# Patient Record
Sex: Female | Born: 1950 | Race: White | Hispanic: No | Marital: Married | Smoking: Former smoker
Health system: Southern US, Community
[De-identification: ages and names within clinical notes are randomized; demographics above are authoritative.]

## PROBLEM LIST (undated history)

## (undated) DIAGNOSIS — F419 Anxiety disorder, unspecified: Secondary | ICD-10-CM

## (undated) DIAGNOSIS — C189 Malignant neoplasm of colon, unspecified: Secondary | ICD-10-CM

## (undated) DIAGNOSIS — L719 Rosacea, unspecified: Secondary | ICD-10-CM

## (undated) DIAGNOSIS — Z87442 Personal history of urinary calculi: Secondary | ICD-10-CM

## (undated) DIAGNOSIS — Z800 Family history of malignant neoplasm of digestive organs: Secondary | ICD-10-CM

## (undated) DIAGNOSIS — K219 Gastro-esophageal reflux disease without esophagitis: Secondary | ICD-10-CM

## (undated) DIAGNOSIS — M549 Dorsalgia, unspecified: Secondary | ICD-10-CM

## (undated) DIAGNOSIS — R238 Other skin changes: Secondary | ICD-10-CM

## (undated) DIAGNOSIS — R42 Dizziness and giddiness: Secondary | ICD-10-CM

## (undated) DIAGNOSIS — D649 Anemia, unspecified: Secondary | ICD-10-CM

## (undated) DIAGNOSIS — G8929 Other chronic pain: Secondary | ICD-10-CM

## (undated) DIAGNOSIS — R87610 Atypical squamous cells of undetermined significance on cytologic smear of cervix (ASC-US): Secondary | ICD-10-CM

## (undated) DIAGNOSIS — E669 Obesity, unspecified: Secondary | ICD-10-CM

## (undated) DIAGNOSIS — J310 Chronic rhinitis: Secondary | ICD-10-CM

## (undated) DIAGNOSIS — Z889 Allergy status to unspecified drugs, medicaments and biological substances status: Secondary | ICD-10-CM

## (undated) DIAGNOSIS — N183 Chronic kidney disease, stage 3 unspecified: Secondary | ICD-10-CM

## (undated) DIAGNOSIS — K5792 Diverticulitis of intestine, part unspecified, without perforation or abscess without bleeding: Secondary | ICD-10-CM

## (undated) DIAGNOSIS — C649 Malignant neoplasm of unspecified kidney, except renal pelvis: Secondary | ICD-10-CM

## (undated) DIAGNOSIS — B3731 Acute candidiasis of vulva and vagina: Secondary | ICD-10-CM

## (undated) DIAGNOSIS — I499 Cardiac arrhythmia, unspecified: Secondary | ICD-10-CM

## (undated) DIAGNOSIS — L821 Other seborrheic keratosis: Secondary | ICD-10-CM

## (undated) DIAGNOSIS — M25561 Pain in right knee: Secondary | ICD-10-CM

## (undated) DIAGNOSIS — J302 Other seasonal allergic rhinitis: Secondary | ICD-10-CM

## (undated) HISTORY — DX: Obesity, unspecified: E66.9

## (undated) HISTORY — DX: Family history of malignant neoplasm of digestive organs: Z80.0

## (undated) HISTORY — PX: TUBAL LIGATION: SHX77

## (undated) HISTORY — PX: BARIATRIC SURGERY: SHX1103

## (undated) HISTORY — DX: Atypical squamous cells of undetermined significance on cytologic smear of cervix (ASC-US): R87.610

## (undated) HISTORY — DX: Other seasonal allergic rhinitis: J30.2

## (undated) HISTORY — DX: Malignant neoplasm of unspecified kidney, except renal pelvis: C64.9

## (undated) HISTORY — DX: Gastro-esophageal reflux disease without esophagitis: K21.9

## (undated) HISTORY — DX: Diverticulitis of intestine, part unspecified, without perforation or abscess without bleeding: K57.92

## (undated) HISTORY — PX: COLON SURGERY: SHX602

## (undated) HISTORY — PX: HEMORRHOID SURGERY: SHX153

## (undated) HISTORY — DX: Anemia, unspecified: D64.9

## (undated) HISTORY — DX: Other seborrheic keratosis: L82.1

## (undated) HISTORY — DX: Malignant neoplasm of colon, unspecified: C18.9

## (undated) HISTORY — DX: Pain in right knee: M25.561

## (undated) HISTORY — DX: Other skin changes: R23.8

## (undated) HISTORY — DX: Rosacea, unspecified: L71.9

## (undated) HISTORY — DX: Dizziness and giddiness: R42

## (undated) HISTORY — PX: CARPAL TUNNEL RELEASE: SHX101

## (undated) HISTORY — PX: KIDNEY SURGERY: SHX687

## (undated) HISTORY — PX: CATARACT EXTRACTION, BILATERAL: SHX1313

## (undated) HISTORY — PX: COLPOSCOPY: SHX161

## (undated) HISTORY — PX: NEPHRECTOMY: SHX65

## (undated) HISTORY — DX: Chronic rhinitis: J31.0

## (undated) HISTORY — PX: HAND SURGERY: SHX662

## (undated) HISTORY — DX: Anxiety disorder, unspecified: F41.9

## (undated) HISTORY — DX: Acute candidiasis of vulva and vagina: B37.31

## (undated) HISTORY — DX: Chronic kidney disease, stage 3 unspecified: N18.30

---
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
## 1980-05-18 HISTORY — PX: GASTRIC BYPASS: SHX52

## 2000-01-26 ENCOUNTER — Other Ambulatory Visit: Admission: RE | Admit: 2000-01-26 | Discharge: 2000-01-26 | Payer: Self-pay | Admitting: *Deleted

## 2000-01-28 ENCOUNTER — Encounter: Payer: Self-pay | Admitting: *Deleted

## 2000-01-28 ENCOUNTER — Ambulatory Visit (HOSPITAL_COMMUNITY): Admission: RE | Admit: 2000-01-28 | Discharge: 2000-01-28 | Payer: Self-pay | Admitting: *Deleted

## 2001-06-17 ENCOUNTER — Ambulatory Visit (HOSPITAL_BASED_OUTPATIENT_CLINIC_OR_DEPARTMENT_OTHER): Admission: RE | Admit: 2001-06-17 | Discharge: 2001-06-17 | Payer: Self-pay | Admitting: Orthopedic Surgery

## 2001-08-12 ENCOUNTER — Ambulatory Visit (HOSPITAL_BASED_OUTPATIENT_CLINIC_OR_DEPARTMENT_OTHER): Admission: RE | Admit: 2001-08-12 | Discharge: 2001-08-12 | Payer: Self-pay | Admitting: Orthopedic Surgery

## 2006-07-05 ENCOUNTER — Encounter: Admission: RE | Admit: 2006-07-05 | Discharge: 2006-07-05 | Payer: Self-pay | Admitting: Orthopedic Surgery

## 2006-09-03 ENCOUNTER — Ambulatory Visit (HOSPITAL_COMMUNITY): Admission: RE | Admit: 2006-09-03 | Discharge: 2006-09-03 | Payer: Self-pay | Admitting: Gastroenterology

## 2006-09-03 ENCOUNTER — Encounter (INDEPENDENT_AMBULATORY_CARE_PROVIDER_SITE_OTHER): Payer: Self-pay | Admitting: Specialist

## 2006-09-12 ENCOUNTER — Encounter: Admission: RE | Admit: 2006-09-12 | Discharge: 2006-09-12 | Payer: Self-pay | Admitting: Surgery

## 2006-09-16 HISTORY — PX: HERNIA REPAIR: SHX51

## 2006-09-20 ENCOUNTER — Emergency Department (HOSPITAL_COMMUNITY): Admission: EM | Admit: 2006-09-20 | Discharge: 2006-09-21 | Payer: Self-pay | Admitting: Emergency Medicine

## 2006-09-20 ENCOUNTER — Encounter (INDEPENDENT_AMBULATORY_CARE_PROVIDER_SITE_OTHER): Payer: Self-pay | Admitting: Specialist

## 2006-09-20 ENCOUNTER — Ambulatory Visit (HOSPITAL_COMMUNITY): Admission: RE | Admit: 2006-09-20 | Discharge: 2006-09-20 | Payer: Self-pay | Admitting: Surgery

## 2006-09-25 ENCOUNTER — Emergency Department (HOSPITAL_COMMUNITY): Admission: EM | Admit: 2006-09-25 | Discharge: 2006-09-26 | Payer: Self-pay | Admitting: Emergency Medicine

## 2006-10-04 ENCOUNTER — Encounter (INDEPENDENT_AMBULATORY_CARE_PROVIDER_SITE_OTHER): Payer: Self-pay | Admitting: Surgery

## 2006-10-04 ENCOUNTER — Inpatient Hospital Stay (HOSPITAL_COMMUNITY): Admission: RE | Admit: 2006-10-04 | Discharge: 2006-10-10 | Payer: Self-pay | Admitting: Surgery

## 2006-10-13 ENCOUNTER — Ambulatory Visit: Payer: Self-pay | Admitting: Oncology

## 2006-11-02 ENCOUNTER — Ambulatory Visit (HOSPITAL_COMMUNITY): Admission: RE | Admit: 2006-11-02 | Discharge: 2006-11-02 | Payer: Self-pay | Admitting: Oncology

## 2006-11-02 LAB — CBC WITH DIFFERENTIAL/PLATELET
BASO%: 0.6 % (ref 0.0–2.0)
Basophils Absolute: 0 10*3/uL (ref 0.0–0.1)
HCT: 34.3 % — ABNORMAL LOW (ref 34.8–46.6)
HGB: 11.6 g/dL (ref 11.6–15.9)
LYMPH%: 20 % (ref 14.0–48.0)
MCH: 26.7 pg (ref 26.0–34.0)
MCHC: 33.9 g/dL (ref 32.0–36.0)
MONO#: 0.4 10*3/uL (ref 0.1–0.9)
NEUT%: 70.4 % (ref 39.6–76.8)
Platelets: 231 10*3/uL (ref 145–400)
WBC: 7.2 10*3/uL (ref 3.9–10.0)

## 2006-11-02 LAB — COMPREHENSIVE METABOLIC PANEL
ALT: 10 U/L (ref 0–35)
BUN: 12 mg/dL (ref 6–23)
CO2: 22 mEq/L (ref 19–32)
Calcium: 8.9 mg/dL (ref 8.4–10.5)
Chloride: 107 mEq/L (ref 96–112)
Creatinine, Ser: 1.38 mg/dL — ABNORMAL HIGH (ref 0.40–1.20)
Total Bilirubin: 0.3 mg/dL (ref 0.3–1.2)

## 2006-11-02 LAB — CEA: CEA: 1.4 ng/mL (ref 0.0–5.0)

## 2006-11-02 LAB — FERRITIN: Ferritin: 67 ng/mL (ref 10–291)

## 2006-11-05 ENCOUNTER — Ambulatory Visit (HOSPITAL_COMMUNITY): Admission: RE | Admit: 2006-11-05 | Discharge: 2006-11-05 | Payer: Self-pay | Admitting: Surgery

## 2006-11-11 LAB — COMPREHENSIVE METABOLIC PANEL
ALT: 11 U/L (ref 0–35)
AST: 12 U/L (ref 0–37)
Albumin: 3.1 g/dL — ABNORMAL LOW (ref 3.5–5.2)
Alkaline Phosphatase: 48 U/L (ref 39–117)
Calcium: 8.9 mg/dL (ref 8.4–10.5)
Chloride: 105 mEq/L (ref 96–112)
Potassium: 4 mEq/L (ref 3.5–5.3)
Sodium: 140 mEq/L (ref 135–145)
Total Protein: 5.9 g/dL — ABNORMAL LOW (ref 6.0–8.3)

## 2006-11-23 ENCOUNTER — Ambulatory Visit: Payer: Self-pay | Admitting: Oncology

## 2006-11-25 LAB — CBC WITH DIFFERENTIAL/PLATELET
BASO%: 0.7 % (ref 0.0–2.0)
Basophils Absolute: 0 10*3/uL (ref 0.0–0.1)
EOS%: 9.9 % — ABNORMAL HIGH (ref 0.0–7.0)
HGB: 11 g/dL — ABNORMAL LOW (ref 11.6–15.9)
MCH: 27 pg (ref 26.0–34.0)
MCHC: 33.9 g/dL (ref 32.0–36.0)
MCV: 79.6 fL — ABNORMAL LOW (ref 81.0–101.0)
MONO%: 3.1 % (ref 0.0–13.0)
RDW: 17.3 % — ABNORMAL HIGH (ref 11.3–14.5)
lymph#: 1.2 10*3/uL (ref 0.9–3.3)

## 2006-11-25 LAB — COMPREHENSIVE METABOLIC PANEL
ALT: 13 U/L (ref 0–35)
AST: 16 U/L (ref 0–37)
Albumin: 3.4 g/dL — ABNORMAL LOW (ref 3.5–5.2)
Alkaline Phosphatase: 64 U/L (ref 39–117)
BUN: 15 mg/dL (ref 6–23)
Chloride: 107 mEq/L (ref 96–112)
Creatinine, Ser: 1.27 mg/dL — ABNORMAL HIGH (ref 0.40–1.20)
Potassium: 4.5 mEq/L (ref 3.5–5.3)

## 2006-12-09 LAB — CBC WITH DIFFERENTIAL/PLATELET
BASO%: 0.9 % (ref 0.0–2.0)
Basophils Absolute: 0.1 10*3/uL (ref 0.0–0.1)
EOS%: 5.1 % (ref 0.0–7.0)
HCT: 32.1 % — ABNORMAL LOW (ref 34.8–46.6)
HGB: 10.4 g/dL — ABNORMAL LOW (ref 11.6–15.9)
MCH: 26.6 pg (ref 26.0–34.0)
MCHC: 32.5 g/dL (ref 32.0–36.0)
MCV: 81.8 fL (ref 81.0–101.0)
MONO%: 8.9 % (ref 0.0–13.0)
NEUT%: 64.7 % (ref 39.6–76.8)

## 2006-12-09 LAB — COMPREHENSIVE METABOLIC PANEL
AST: 22 U/L (ref 0–37)
Alkaline Phosphatase: 56 U/L (ref 39–117)
BUN: 12 mg/dL (ref 6–23)
Creatinine, Ser: 1.03 mg/dL (ref 0.40–1.20)
Total Bilirubin: 0.5 mg/dL (ref 0.3–1.2)

## 2006-12-23 LAB — CBC WITH DIFFERENTIAL/PLATELET
Basophils Absolute: 0 10*3/uL (ref 0.0–0.1)
EOS%: 5 % (ref 0.0–7.0)
HCT: 30.7 % — ABNORMAL LOW (ref 34.8–46.6)
HGB: 10.6 g/dL — ABNORMAL LOW (ref 11.6–15.9)
LYMPH%: 24.7 % (ref 14.0–48.0)
MCH: 28.4 pg (ref 26.0–34.0)
MCV: 82 fL (ref 81.0–101.0)
NEUT%: 64.5 % (ref 39.6–76.8)
Platelets: 98 10*3/uL — ABNORMAL LOW (ref 145–400)
lymph#: 1.2 10*3/uL (ref 0.9–3.3)

## 2006-12-23 LAB — COMPREHENSIVE METABOLIC PANEL
AST: 20 U/L (ref 0–37)
BUN: 6 mg/dL (ref 6–23)
Calcium: 8.6 mg/dL (ref 8.4–10.5)
Chloride: 103 mEq/L (ref 96–112)
Creatinine, Ser: 1.22 mg/dL — ABNORMAL HIGH (ref 0.40–1.20)
Total Bilirubin: 0.4 mg/dL (ref 0.3–1.2)

## 2007-01-06 LAB — COMPREHENSIVE METABOLIC PANEL
BUN: 12 mg/dL (ref 6–23)
CO2: 27 mEq/L (ref 19–32)
Calcium: 9.1 mg/dL (ref 8.4–10.5)
Chloride: 108 mEq/L (ref 96–112)
Creatinine, Ser: 1.15 mg/dL (ref 0.40–1.20)

## 2007-01-06 LAB — CBC WITH DIFFERENTIAL/PLATELET
Basophils Absolute: 0 10*3/uL (ref 0.0–0.1)
HCT: 30.4 % — ABNORMAL LOW (ref 34.8–46.6)
HGB: 10.4 g/dL — ABNORMAL LOW (ref 11.6–15.9)
MCH: 28.8 pg (ref 26.0–34.0)
MONO#: 0.4 10*3/uL (ref 0.1–0.9)
NEUT%: 60.1 % (ref 39.6–76.8)
WBC: 4.4 10*3/uL (ref 3.9–10.0)
lymph#: 1.1 10*3/uL (ref 0.9–3.3)

## 2007-01-08 ENCOUNTER — Ambulatory Visit: Payer: Self-pay | Admitting: Oncology

## 2007-01-20 LAB — CBC WITH DIFFERENTIAL/PLATELET
Eosinophils Absolute: 0.2 10*3/uL (ref 0.0–0.5)
HCT: 33.7 % — ABNORMAL LOW (ref 34.8–46.6)
LYMPH%: 17.8 % (ref 14.0–48.0)
MONO#: 0.4 10*3/uL (ref 0.1–0.9)
NEUT#: 6 10*3/uL (ref 1.5–6.5)
NEUT%: 73.6 % (ref 39.6–76.8)
Platelets: 113 10*3/uL — ABNORMAL LOW (ref 145–400)
WBC: 8.1 10*3/uL (ref 3.9–10.0)

## 2007-01-20 LAB — COMPREHENSIVE METABOLIC PANEL
ALT: 15 U/L (ref 0–35)
CO2: 25 mEq/L (ref 19–32)
Calcium: 9.3 mg/dL (ref 8.4–10.5)
Chloride: 107 mEq/L (ref 96–112)
Sodium: 139 mEq/L (ref 135–145)
Total Protein: 6.7 g/dL (ref 6.0–8.3)

## 2007-02-03 LAB — COMPREHENSIVE METABOLIC PANEL
ALT: 14 U/L (ref 0–35)
AST: 19 U/L (ref 0–37)
CO2: 28 mEq/L (ref 19–32)
Creatinine, Ser: 1.16 mg/dL (ref 0.40–1.20)
Total Bilirubin: 0.3 mg/dL (ref 0.3–1.2)

## 2007-02-03 LAB — CBC WITH DIFFERENTIAL/PLATELET
BASO%: 0.6 % (ref 0.0–2.0)
EOS%: 5 % (ref 0.0–7.0)
HCT: 30.4 % — ABNORMAL LOW (ref 34.8–46.6)
LYMPH%: 21.7 % (ref 14.0–48.0)
MCH: 30 pg (ref 26.0–34.0)
MCHC: 34.2 g/dL (ref 32.0–36.0)
NEUT%: 68.9 % (ref 39.6–76.8)
Platelets: 130 10*3/uL — ABNORMAL LOW (ref 145–400)

## 2007-02-17 LAB — CBC WITH DIFFERENTIAL/PLATELET
BASO%: 0.4 % (ref 0.0–2.0)
Basophils Absolute: 0 10*3/uL (ref 0.0–0.1)
HCT: 29.6 % — ABNORMAL LOW (ref 34.8–46.6)
LYMPH%: 21.2 % (ref 14.0–48.0)
MCHC: 34.8 g/dL (ref 32.0–36.0)
MONO#: 0.3 10*3/uL (ref 0.1–0.9)
NEUT%: 66.4 % (ref 39.6–76.8)
Platelets: 110 10*3/uL — ABNORMAL LOW (ref 145–400)
WBC: 5.4 10*3/uL (ref 3.9–10.0)

## 2007-02-17 LAB — COMPREHENSIVE METABOLIC PANEL
ALT: 15 U/L (ref 0–35)
BUN: 11 mg/dL (ref 6–23)
CO2: 27 mEq/L (ref 19–32)
Creatinine, Ser: 1.25 mg/dL — ABNORMAL HIGH (ref 0.40–1.20)
Glucose, Bld: 153 mg/dL — ABNORMAL HIGH (ref 70–99)
Total Bilirubin: 0.3 mg/dL (ref 0.3–1.2)

## 2007-02-25 ENCOUNTER — Ambulatory Visit: Payer: Self-pay | Admitting: Oncology

## 2007-03-01 ENCOUNTER — Ambulatory Visit (HOSPITAL_COMMUNITY): Admission: RE | Admit: 2007-03-01 | Discharge: 2007-03-01 | Payer: Self-pay | Admitting: Oncology

## 2007-03-01 LAB — CBC WITH DIFFERENTIAL/PLATELET
Eosinophils Absolute: 0.2 10*3/uL (ref 0.0–0.5)
HCT: 30.6 % — ABNORMAL LOW (ref 34.8–46.6)
HGB: 10.7 g/dL — ABNORMAL LOW (ref 11.6–15.9)
LYMPH%: 19.9 % (ref 14.0–48.0)
MONO#: 0.5 10*3/uL (ref 0.1–0.9)
NEUT#: 4.1 10*3/uL (ref 1.5–6.5)
NEUT%: 67.7 % (ref 39.6–76.8)
Platelets: 85 10*3/uL — ABNORMAL LOW (ref 145–400)
WBC: 6.1 10*3/uL (ref 3.9–10.0)
lymph#: 1.2 10*3/uL (ref 0.9–3.3)

## 2007-03-01 LAB — COMPREHENSIVE METABOLIC PANEL
AST: 20 U/L (ref 0–37)
Albumin: 3.2 g/dL — ABNORMAL LOW (ref 3.5–5.2)
Alkaline Phosphatase: 68 U/L (ref 39–117)
Potassium: 4.1 mEq/L (ref 3.5–5.3)
Sodium: 142 mEq/L (ref 135–145)
Total Protein: 6.6 g/dL (ref 6.0–8.3)

## 2007-03-01 LAB — URINALYSIS, MICROSCOPIC - CHCC
Protein: <30 mg/dL
pH: 6 (ref 4.6–8.0)

## 2007-03-03 LAB — CBC WITH DIFFERENTIAL/PLATELET
Basophils Absolute: 0 10*3/uL (ref 0.0–0.1)
EOS%: 5.3 % (ref 0.0–7.0)
Eosinophils Absolute: 0.3 10*3/uL (ref 0.0–0.5)
HCT: 31 % — ABNORMAL LOW (ref 34.8–46.6)
HGB: 10.4 g/dL — ABNORMAL LOW (ref 11.6–15.9)
MCH: 30.9 pg (ref 26.0–34.0)
MCV: 92 fL (ref 81.0–101.0)
NEUT#: 3 10*3/uL (ref 1.5–6.5)
NEUT%: 64.6 % (ref 39.6–76.8)
lymph#: 1.1 10*3/uL (ref 0.9–3.3)

## 2007-03-03 LAB — URINE CULTURE

## 2007-03-10 LAB — CBC WITH DIFFERENTIAL/PLATELET
BASO%: 0.5 % (ref 0.0–2.0)
HCT: 30.9 % — ABNORMAL LOW (ref 34.8–46.6)
HGB: 10.7 g/dL — ABNORMAL LOW (ref 11.6–15.9)
MCH: 31.5 pg (ref 26.0–34.0)
MCV: 90.8 fL (ref 81.0–101.0)
MONO#: 0.3 10*3/uL (ref 0.1–0.9)
Platelets: 147 10*3/uL (ref 145–400)
RBC: 3.4 10*6/uL — ABNORMAL LOW (ref 3.70–5.32)
lymph#: 1.4 10*3/uL (ref 0.9–3.3)

## 2007-03-17 LAB — COMPREHENSIVE METABOLIC PANEL
ALT: 14 U/L (ref 0–35)
CO2: 29 mEq/L (ref 19–32)
Calcium: 9.7 mg/dL (ref 8.4–10.5)
Chloride: 102 mEq/L (ref 96–112)
Sodium: 140 mEq/L (ref 135–145)
Total Bilirubin: 0.6 mg/dL (ref 0.3–1.2)
Total Protein: 6.7 g/dL (ref 6.0–8.3)

## 2007-03-17 LAB — CBC WITH DIFFERENTIAL/PLATELET
BASO%: 0.6 % (ref 0.0–2.0)
MCHC: 33.7 g/dL (ref 32.0–36.0)
MONO#: 0.4 10*3/uL (ref 0.1–0.9)
RBC: 3.62 10*6/uL — ABNORMAL LOW (ref 3.70–5.32)
WBC: 6.8 10*3/uL (ref 3.9–10.0)
lymph#: 1.2 10*3/uL (ref 0.9–3.3)

## 2007-03-31 LAB — CBC WITH DIFFERENTIAL/PLATELET
BASO%: 0.4 % (ref 0.0–2.0)
EOS%: 3.7 % (ref 0.0–7.0)
LYMPH%: 16.4 % (ref 14.0–48.0)
MCH: 31 pg (ref 26.0–34.0)
MCHC: 33.9 g/dL (ref 32.0–36.0)
MONO#: 0.4 10*3/uL (ref 0.1–0.9)
Platelets: 150 10*3/uL (ref 145–400)
RBC: 3.47 10*6/uL — ABNORMAL LOW (ref 3.70–5.32)
WBC: 6.5 10*3/uL (ref 3.9–10.0)

## 2007-04-19 ENCOUNTER — Ambulatory Visit: Payer: Self-pay | Admitting: Oncology

## 2007-04-21 LAB — CBC WITH DIFFERENTIAL/PLATELET
BASO%: 0.5 % (ref 0.0–2.0)
EOS%: 3.1 % (ref 0.0–7.0)
HCT: 32.3 % — ABNORMAL LOW (ref 34.8–46.6)
MCHC: 35 g/dL (ref 32.0–36.0)
MONO#: 0.4 10*3/uL (ref 0.1–0.9)
NEUT%: 68.6 % (ref 39.6–76.8)
RBC: 3.5 10*6/uL — ABNORMAL LOW (ref 3.70–5.32)
RDW: 15.6 % — ABNORMAL HIGH (ref 11.3–14.5)
WBC: 6.2 10*3/uL (ref 3.9–10.0)
lymph#: 1.4 10*3/uL (ref 0.9–3.3)

## 2007-04-21 LAB — COMPREHENSIVE METABOLIC PANEL
ALT: 12 U/L (ref 0–35)
AST: 19 U/L (ref 0–37)
Albumin: 3.4 g/dL — ABNORMAL LOW (ref 3.5–5.2)
CO2: 23 mEq/L (ref 19–32)
Calcium: 9.2 mg/dL (ref 8.4–10.5)
Chloride: 102 mEq/L (ref 96–112)
Potassium: 3.5 mEq/L (ref 3.5–5.3)
Sodium: 134 mEq/L — ABNORMAL LOW (ref 135–145)
Total Protein: 6.9 g/dL (ref 6.0–8.3)

## 2007-05-27 ENCOUNTER — Other Ambulatory Visit: Admission: RE | Admit: 2007-05-27 | Discharge: 2007-05-27 | Payer: Self-pay | Admitting: Gynecology

## 2007-06-02 ENCOUNTER — Ambulatory Visit: Payer: Self-pay | Admitting: Oncology

## 2007-07-12 ENCOUNTER — Ambulatory Visit: Payer: Self-pay | Admitting: Oncology

## 2007-08-18 ENCOUNTER — Ambulatory Visit (HOSPITAL_COMMUNITY): Admission: RE | Admit: 2007-08-18 | Discharge: 2007-08-18 | Payer: Self-pay | Admitting: Oncology

## 2007-08-18 LAB — COMPREHENSIVE METABOLIC PANEL
Alkaline Phosphatase: 73 U/L (ref 39–117)
BUN: 15 mg/dL (ref 6–23)
Glucose, Bld: 101 mg/dL — ABNORMAL HIGH (ref 70–99)
Total Bilirubin: 0.6 mg/dL (ref 0.3–1.2)

## 2007-08-18 LAB — CBC WITH DIFFERENTIAL/PLATELET
Basophils Absolute: 0 10*3/uL (ref 0.0–0.1)
Eosinophils Absolute: 0.2 10*3/uL (ref 0.0–0.5)
HCT: 34.6 % — ABNORMAL LOW (ref 34.8–46.6)
HGB: 11.9 g/dL (ref 11.6–15.9)
LYMPH%: 20.7 % (ref 14.0–48.0)
MCV: 82.1 fL (ref 81.0–101.0)
MONO%: 4.9 % (ref 0.0–13.0)
NEUT#: 5 10*3/uL (ref 1.5–6.5)
NEUT%: 70.8 % (ref 39.6–76.8)
Platelets: 193 10*3/uL (ref 145–400)

## 2007-08-18 LAB — CEA: CEA: <0.5 ng/mL (ref 0.0–5.0)

## 2007-08-23 ENCOUNTER — Ambulatory Visit: Payer: Self-pay | Admitting: Oncology

## 2007-08-25 LAB — BASIC METABOLIC PANEL
CO2: 24 mEq/L (ref 19–32)
Glucose, Bld: 82 mg/dL (ref 70–99)
Potassium: 4.5 mEq/L (ref 3.5–5.3)
Sodium: 141 mEq/L (ref 135–145)

## 2007-09-08 ENCOUNTER — Ambulatory Visit (HOSPITAL_COMMUNITY): Admission: RE | Admit: 2007-09-08 | Discharge: 2007-09-08 | Payer: Self-pay | Admitting: Surgery

## 2008-02-18 IMAGING — CT CT CHEST W/O CM
2 of 4 series · 15 of 36 positions shown, 18 images · IV contrast (agent unspecified)
Comparison: 11/02/06.

CLINICAL DATA: Colon cancer.  Chemotherapy completed.  Followup prevascular lymph nodes seen on 11/02/06. 
CHEST CT WITHOUT CONTRAST:
TECHNIQUE: Multidetector CT imaging of the chest was performed following the standard protocol without IV contrast.

[Series 2: chest routine 5.0 b40f · axial · 0.74mm/px · z∈[-342,-38]mm · 12 of 67 slices shown, 15 images]
[im 3/67  mediastinal]
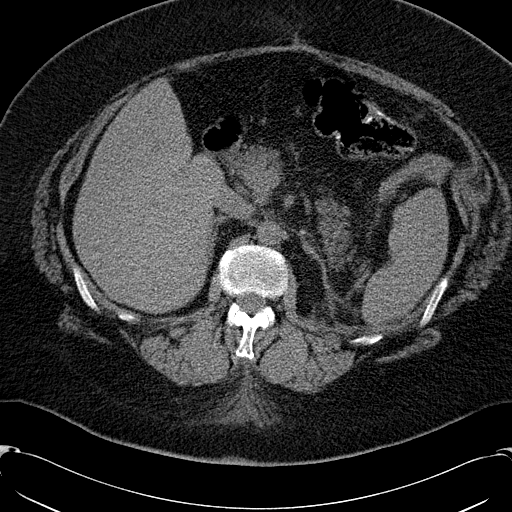
[im 3/67  lung]
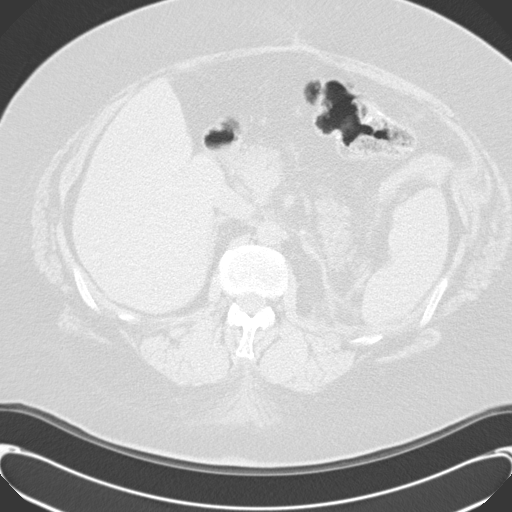
[im 9/67  lung]
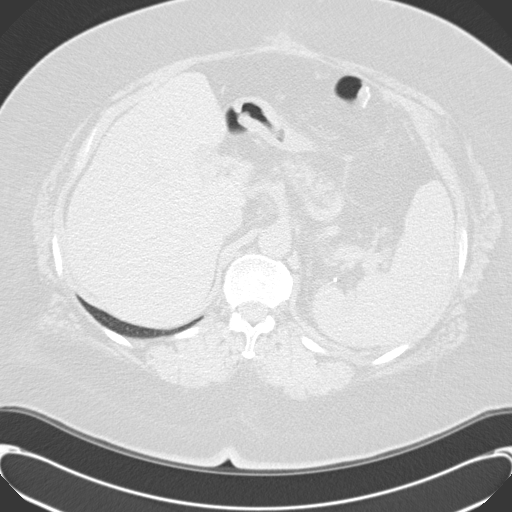
[im 14/67  lung]
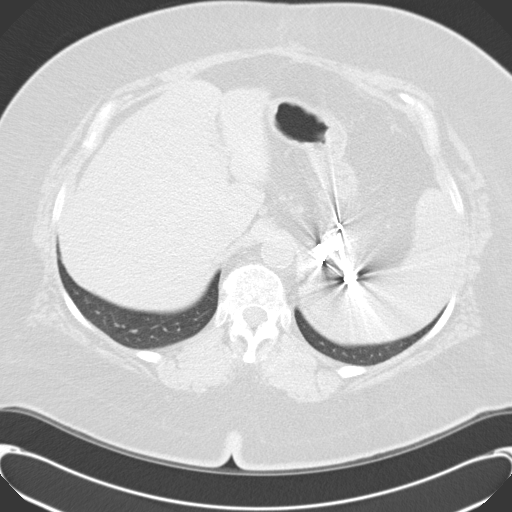
[im 20/67  lung]
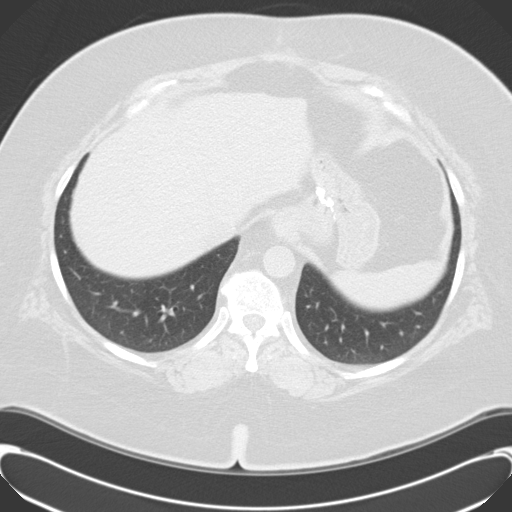
[im 25/67  mediastinal]
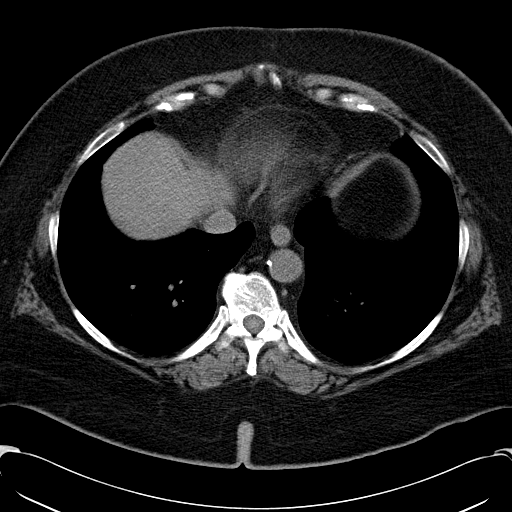
[im 25/67  lung]
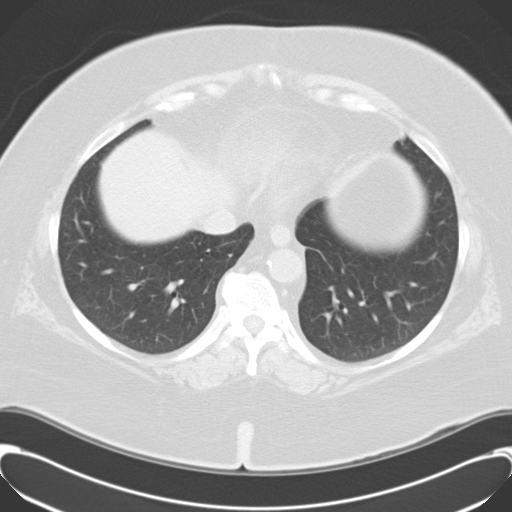
[im 31/67  lung]
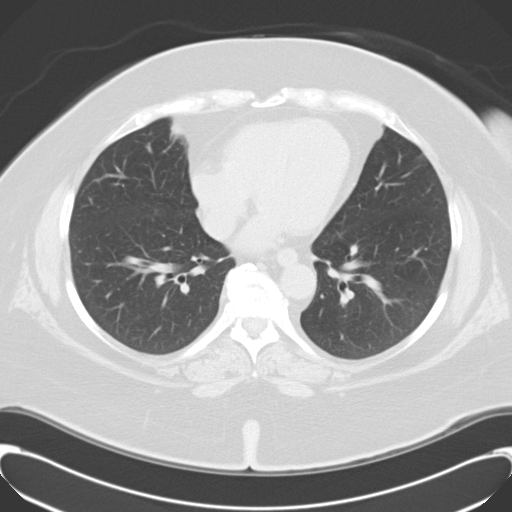
[im 36/67  lung]
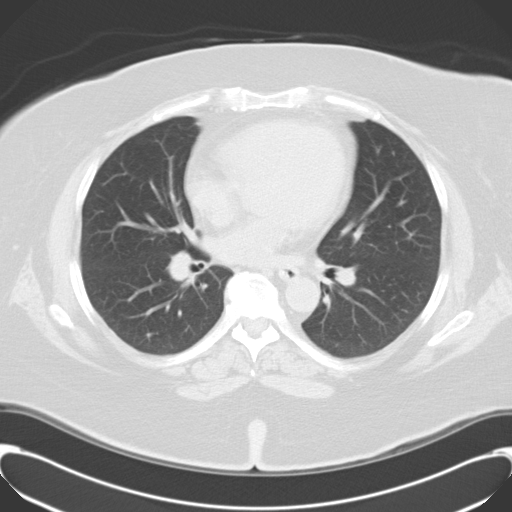
[im 42/67  lung]
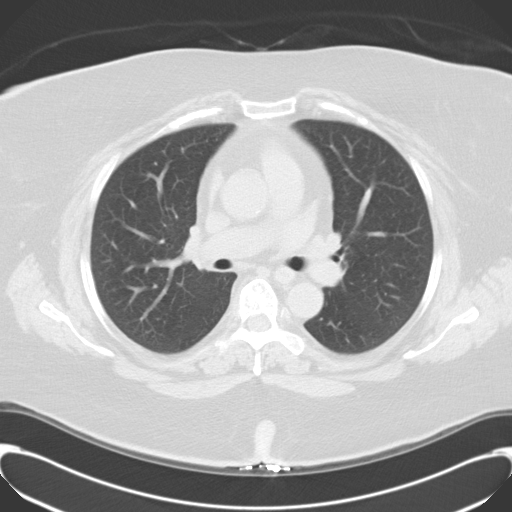
[im 47/67  mediastinal]
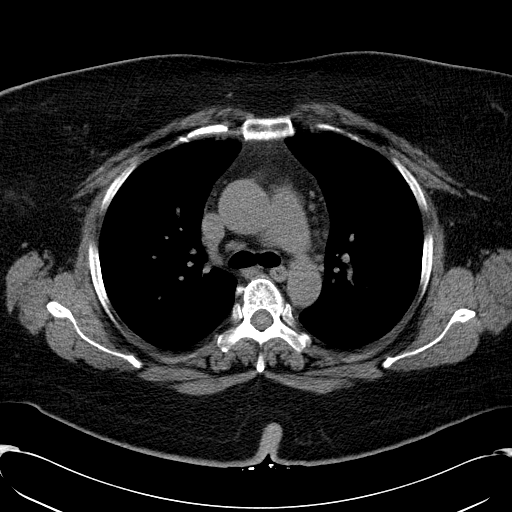
[im 47/67  lung]
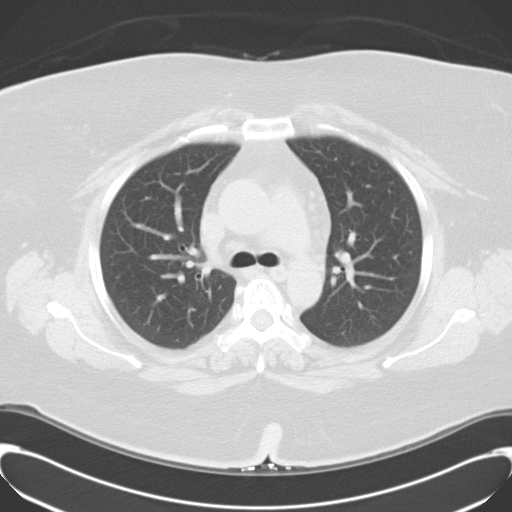
[im 53/67  lung]
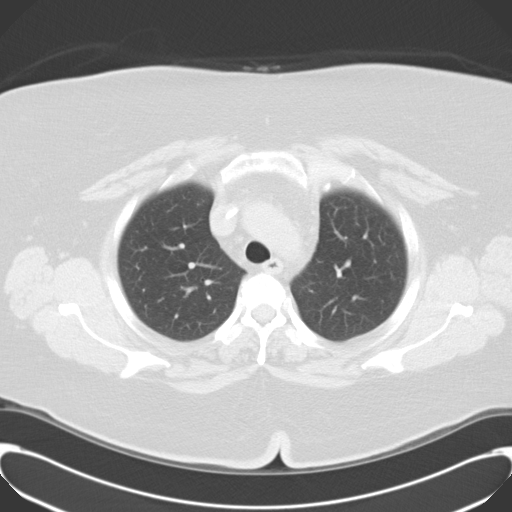
[im 58/67  lung]
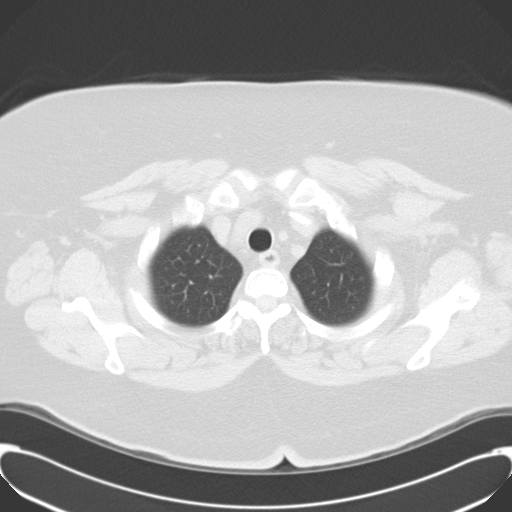
[im 64/67  lung]
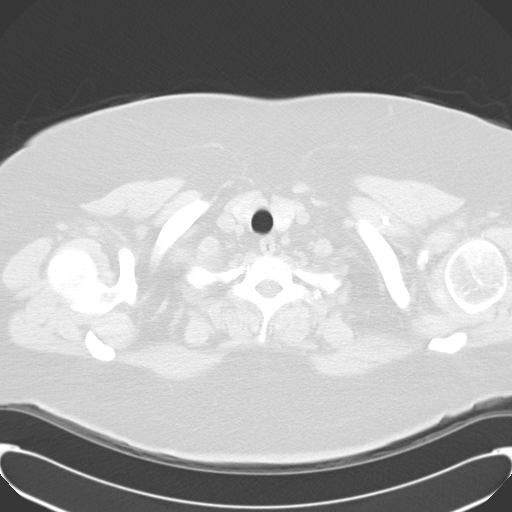

[Series 602: <mpr thick range> · coronal · 0.74mm/px · 3 of 85 slices shown]
[im 17/85  lung]
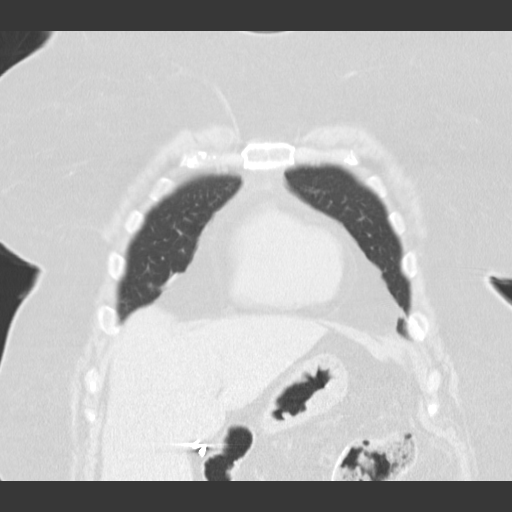
[im 34/85  lung]
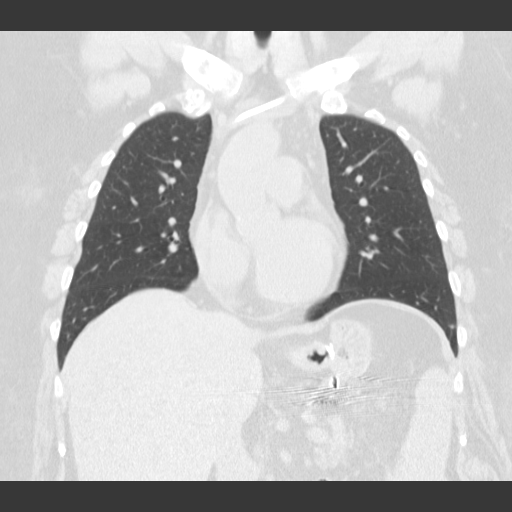
[im 51/85  lung]
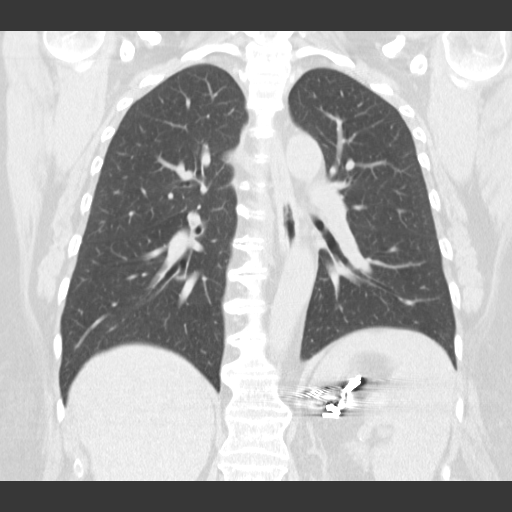

[15 of 36 positions shown; findings below may reference images not displayed]

FINDINGS: revascular lymph node presently measures 11.1 x 13.3 x 12.6 mm and previously measured 11.2 x 13.2 x 13.2 mm and therefore is stable.  Right hilar lymph node on axial images appears slightly prominent at 11.1 x 14.9 mm however, on coronal imaging this appear to be a flat-like lymph node measuring 3.7 mm in height.  On coronal imaging this appears similar to the prior exam although not well seen on prior axial images probably related to slight differences in sectioning plane.  Inferior lingula and left base subsegmental atelectatic changes.  On the sagittal reconstructed images, there is minimal nodularity along the posterior inferior left base (series 607 image 94) which may be related to atelectasis rather than a true nodule.  This can be evaluated on followup.  Left central line tip proximal superior vena cava.  Persistent heterogeneous appearance of the thyroid gland.  Ultrasound could be obtained for further delineation.  Degenerative changes throughout the thoracic spine without bony destructive lesion.  Postsurgical changes left renal bed.  Unenhanced imaging of the liver without obvious mass.
IMPRESSION: 1.  Similar appearance of slightly prominent size prevascular lymph node as described above.   Right hilar lymph node although prominent on axial images appears flat-like on coronal images and is unchanged. 
2.  Left base minimal nodularity may be related to subsegmental atelectatic changes.  Stability can be confirmed on followup.
3.  Postsurgical changes left renal bed region incompletely evaluated on present exam.  
4.  Mild calcification aortic root and coronary arteries.  
5.  Degenerative changes thoracic spine.

## 2008-02-29 ENCOUNTER — Ambulatory Visit: Payer: Self-pay | Admitting: Oncology

## 2008-03-02 LAB — BASIC METABOLIC PANEL
CO2: 27 mEq/L (ref 19–32)
Chloride: 105 mEq/L (ref 96–112)
Creatinine, Ser: 1.25 mg/dL — ABNORMAL HIGH (ref 0.40–1.20)
Sodium: 142 mEq/L (ref 135–145)

## 2008-08-29 ENCOUNTER — Ambulatory Visit: Payer: Self-pay | Admitting: Oncology

## 2008-08-31 LAB — CBC WITH DIFFERENTIAL/PLATELET
Eosinophils Absolute: 0.3 10*3/uL (ref 0.0–0.5)
MCV: 83.7 fL (ref 79.5–101.0)
MONO#: 0.3 10*3/uL (ref 0.1–0.9)
MONO%: 3.8 % (ref 0.0–14.0)
NEUT#: 4.7 10*3/uL (ref 1.5–6.5)
RBC: 4.26 10*6/uL (ref 3.70–5.45)
RDW: 15.1 % — ABNORMAL HIGH (ref 11.2–14.5)
WBC: 6.9 10*3/uL (ref 3.9–10.3)

## 2008-08-31 LAB — BASIC METABOLIC PANEL
Glucose, Bld: 90 mg/dL (ref 70–99)
Potassium: 4.5 mEq/L (ref 3.5–5.3)
Sodium: 142 mEq/L (ref 135–145)

## 2008-08-31 LAB — CEA: CEA: <0.5 ng/mL (ref 0.0–5.0)

## 2009-02-26 ENCOUNTER — Ambulatory Visit: Payer: Self-pay | Admitting: Oncology

## 2009-07-29 ENCOUNTER — Ambulatory Visit: Payer: Self-pay | Admitting: Oncology

## 2009-07-31 LAB — CBC WITH DIFFERENTIAL/PLATELET
BASO%: 0.5 % (ref 0.0–2.0)
Basophils Absolute: 0 10e3/uL (ref 0.0–0.1)
EOS%: 2.8 % (ref 0.0–7.0)
Eosinophils Absolute: 0.2 10e3/uL (ref 0.0–0.5)
HCT: 35.3 % (ref 34.8–46.6)
HGB: 11.8 g/dL (ref 11.6–15.9)
LYMPH%: 18.8 % (ref 14.0–49.7)
MCH: 29 pg (ref 25.1–34.0)
MCHC: 33.5 g/dL (ref 31.5–36.0)
MCV: 86.8 fL (ref 79.5–101.0)
MONO#: 0.2 10e3/uL (ref 0.1–0.9)
MONO%: 3.3 % (ref 0.0–14.0)
NEUT#: 4.6 10e3/uL (ref 1.5–6.5)
NEUT%: 74.6 % (ref 38.4–76.8)
Platelets: 171 10e3/uL (ref 145–400)
RBC: 4.07 10e6/uL (ref 3.70–5.45)
RDW: 15.3 % — ABNORMAL HIGH (ref 11.2–14.5)
WBC: 6.2 10e3/uL (ref 3.9–10.3)
lymph#: 1.2 10e3/uL (ref 0.9–3.3)

## 2009-07-31 LAB — COMPREHENSIVE METABOLIC PANEL WITH GFR
ALT: 17 U/L (ref 0–35)
AST: 19 U/L (ref 0–37)
Albumin: 3.3 g/dL — ABNORMAL LOW (ref 3.5–5.2)
Alkaline Phosphatase: 58 U/L (ref 39–117)
BUN: 10 mg/dL (ref 6–23)
CO2: 28 meq/L (ref 19–32)
Calcium: 9.1 mg/dL (ref 8.4–10.5)
Chloride: 108 meq/L (ref 96–112)
Creatinine, Ser: 1.24 mg/dL — ABNORMAL HIGH (ref 0.40–1.20)
Glucose, Bld: 135 mg/dL — ABNORMAL HIGH (ref 70–99)
Potassium: 3.9 meq/L (ref 3.5–5.3)
Sodium: 141 meq/L (ref 135–145)
Total Bilirubin: 0.6 mg/dL (ref 0.3–1.2)
Total Protein: 7 g/dL (ref 6.0–8.3)

## 2009-07-31 LAB — CEA: CEA: 0.9 ng/mL (ref 0.0–5.0)

## 2009-10-16 HISTORY — PX: HYSTEROSCOPY: SHX211

## 2009-10-21 ENCOUNTER — Ambulatory Visit: Payer: Self-pay | Admitting: Gynecology

## 2009-10-21 ENCOUNTER — Other Ambulatory Visit: Admission: RE | Admit: 2009-10-21 | Discharge: 2009-10-21 | Payer: Self-pay | Admitting: Gynecology

## 2009-10-28 ENCOUNTER — Ambulatory Visit: Payer: Self-pay | Admitting: Gynecology

## 2009-10-30 ENCOUNTER — Ambulatory Visit: Payer: Self-pay | Admitting: Gynecology

## 2009-11-04 ENCOUNTER — Ambulatory Visit (HOSPITAL_BASED_OUTPATIENT_CLINIC_OR_DEPARTMENT_OTHER): Admission: RE | Admit: 2009-11-04 | Discharge: 2009-11-04 | Payer: Self-pay | Admitting: Gynecology

## 2009-11-04 ENCOUNTER — Ambulatory Visit: Payer: Self-pay | Admitting: Gynecology

## 2009-11-19 ENCOUNTER — Ambulatory Visit: Payer: Self-pay | Admitting: Gynecology

## 2009-12-01 ENCOUNTER — Encounter: Admission: RE | Admit: 2009-12-01 | Discharge: 2009-12-01 | Payer: Self-pay | Admitting: Orthopedic Surgery

## 2010-02-14 ENCOUNTER — Ambulatory Visit: Payer: Self-pay | Admitting: Oncology

## 2010-02-18 LAB — BUN: BUN: 18 mg/dL (ref 6–23)

## 2010-04-18 ENCOUNTER — Ambulatory Visit: Payer: Self-pay | Admitting: Gynecology

## 2010-06-08 ENCOUNTER — Encounter: Payer: Self-pay | Admitting: Orthopedic Surgery

## 2010-08-19 ENCOUNTER — Other Ambulatory Visit: Payer: Self-pay | Admitting: Oncology

## 2010-08-19 ENCOUNTER — Encounter (HOSPITAL_BASED_OUTPATIENT_CLINIC_OR_DEPARTMENT_OTHER): Payer: 59 | Admitting: Oncology

## 2010-08-19 DIAGNOSIS — M545 Low back pain: Secondary | ICD-10-CM

## 2010-08-19 DIAGNOSIS — C189 Malignant neoplasm of colon, unspecified: Secondary | ICD-10-CM

## 2010-08-19 DIAGNOSIS — G8929 Other chronic pain: Secondary | ICD-10-CM

## 2010-08-19 DIAGNOSIS — N289 Disorder of kidney and ureter, unspecified: Secondary | ICD-10-CM

## 2010-08-19 LAB — CEA: CEA: 1.1 ng/mL (ref 0.0–5.0)

## 2010-09-30 NOTE — Op Note (Signed)
NAMEMarland Kitchen  Crystal York, Crystal York NO.:  [id number]   MEDICAL RECORD NO.:  [id number]          PATIENT TYPE:  OUT   LOCATION:  XRAY                         FACILITY:  [facility]   PHYSICIAN:  Wilmon Arms. Corliss Skains, M.D. DATE OF BIRTH:  [birth year]   DATE OF PROCEDURE:  11/05/2006  DATE OF DISCHARGE:  11/02/2006                               OPERATIVE REPORT   PREOPERATIVE DIAGNOSIS:  1. Colon cancer.  .  2. Left renal cell carcinoma.   POSTOPERATIVE DIAGNOSIS:  1. Colon cancer.  2. Left renal cell carcinoma.   PROCEDURE PERFORMED:  Left subclavian vein port placement.   SURGEON:  Wilmon Arms. Tsuei, M.D.   ANESTHESIA:  General.   INDICATIONS:  The patient is a 60 year old female who is status post a  recent extended left hemicolectomy, left nephrectomy and primary repair  of a ventral hernia.  She is planning on undergoing chemotherapy and  presents for port placement.   DESCRIPTION OF PROCEDURE:  The patient was brought  to the operating  room, placed in the supine position on the operating table.  After an  adequate level of general anesthesia was obtained, the patient was  positioned with arms tucked.  The patient is morbidly obese and her  breasts were taped down out of the way to provide access to her  clavicles.  Both sides of her chest and neck were prepped with Betadine  and draped in sterile fashion.  A time-out was taken to assure the  proper patient and proper procedure.  The patient was positioned in  Trendelenburg.  The area under her left clavicle was infiltrated with 1%  lidocaine.  An 18 gauge needle was used to cannulate the subclavian  vein.  Good blood return was noted.  The wire was passed easily.  The  needle was then removed.  Fluoroscopy then showed that the wire headed  up the jugular vein into the neck.  With the use of fluoroscopic  guidance,  the wire was slowly withdrawn.  Was redirected down into the  subclavian vein through the brachiocephalic into  the superior vena cava.  We then made a subcutaneous pocket inferior to the insertion site.  The  catheter was tunneled from the subcutaneous pocket to the insertion  site.  We used a 9.6 [place] pre attached export catheter.  The catheter  was flushed.  It was sutured in place with 2-0 Prolene sutures.  The  fluoroscopy was then used to measure the catheter over the patient's  chest.  The catheter was cut at 25 cm.  The introducer and breakaway  sheath were then passed over the wire.  The wire and introducer removed  and a catheter was threaded through the breakaway sheath.  The sheath  was removed.  The catheter was then aspirated with good blood return and  flushed easily with heparinized saline.  Fluoroscopy confirmed that  there were no kinks along the catheter.  The tip of the catheter was  noted to be in the superior vena cava just above the cavoatrial  junction.  The patient has a  very short mediastinum.  The catheter was  then instilled with concentrated heparin solution.  The wound was then  closed with deep layer of 3-0 Vicryl suture and a subcuticular layer of  4-0 Monocryl.  Steri-Strips and clean dressings were applied.  The  patient was extubated, brought to recovery in stable condition.  A chest  x-ray is pending.      Wilmon Arms. Tsuei, M.D.  Electronically Signed     MKT/MEDQ  D:  11/05/2006  T:  11/06/2006  Job:  [id number]   cc:   Leighton Roach. Truett Perna, M.D.  Fax: [phone]

## 2010-09-30 NOTE — Discharge Summary (Signed)
NAMEROSLAND, RIDING                  ACCOUNT NO.:  [id number]   MEDICAL RECORD NO.:  [id number]          PATIENT TYPE:  INP   LOCATION:  1540                         [facility]AC[facility]   PHYSICIAN:  Wilmon Arms. Corliss Skains, M.D. DATE OF BIRTH:  [birth year]   DATE OF ADMISSION:  10/04/2006  DATE OF DISCHARGE:  10/10/2006                               DISCHARGE SUMMARY   ADMISSION DIAGNOSES:  1. Transverse colon cancer.  2. Left renal cell carcinoma.  3. Ventral hernia.   DISCHARGE DIAGNOSES:  1. Transverse colon cancer.  2. Left renal cell carcinoma.  3. Ventral hernia.  4. 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   PROCEDURES PERFORMED:  1. Left hemicolectomy.  2. Left nephrectomy by Dr. Brunilda Payor.  3. Ventral hernia repair.   BRIEF HISTORY:  The patient is a very pleasant 60 year old female who  was recently diagnosed with colon cancer after noticing a change in  bowel habits.  She has had a ventral hernia for some time, and it never  really bothered her.  She underwent a colonoscopy by Dr. 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  Her preoperative workup included a CT scan which  showed a mass in the left kidney.  An MRI confirmed this, and ultrasound  showed that this was a renal cell carcinoma.   HOSPITAL COURSE:  On Oct 04, 2006 the patient underwent exploratory  laparotomy with an extended left hemicolectomy of the cancer at the  splenic flexure.  She also underwent a left radical nephrectomy by Dr.  Brunilda Payor.  Her hernia defect was fairly small and was included in the  closure of the abdominal wall.  No mesh was used.   Postoperatively, the patient has done very well.  She had a  postoperative ileus for a couple days.  This has slowly resolved, and  she is having bowel movements and is tolerating a regular diet.  She has  been ambulating in the halls.  Her wound looks good.   DISCHARGE INSTRUCTIONS:  The patient  will follow up with Dr. Fatima Sanger  office Wednesday or Thursday for staple removal.  Follow up in 2-3 weeks  with Dr. Brunilda Payor.  We will also refer her as an [facility] for discussion of chemotherapy for her colon cancer.  Her  pathology showed that 6 out of 13 nodes were positive.  She was given  Percocet p.r.n. for pain.  She was also given Diflucan 150 mg, 2 tablets  for yeast infection.      Wilmon Arms. Tsuei, M.D.  Electronically Signed     MKT/MEDQ  D:  10/10/2006  T:  10/10/2006  Job:  [id number]   cc:   Jordan Hawks. Elnoria Howard, MD  Fax: [phone]   Lindaann Slough, M.D.  Fax: [phone]

## 2010-09-30 NOTE — Op Note (Signed)
NAMEAIRYANA, SPRUNGER                  ACCOUNT NO.:  [id number]   MEDICAL RECORD NO.:  [id number]          PATIENT TYPE:  INP   LOCATION:  0009                         FACILITY:  [facility]   PHYSICIAN:  Wilmon Arms. Corliss Skains, M.D. DATE OF BIRTH:  25-Jul-1950   DATE OF PROCEDURE:  10/04/2006  DATE OF DISCHARGE:                               OPERATIVE REPORT   PREOPERATIVE DIAGNOSIS:  1. Transverse colon cancer.  2. Left renal cell carcinoma.  3. Ventral hernia.   SURGEON:  Wilmon Arms. Corliss Skains, M.D.   CO-SURGEON:  Dr. Su Grand (left nephrectomy).   ASSISTANT:  Was Dr. Ovidio Kin and Velora Heckler, M.D.   ANESTHESIA:  General endotracheal.   PROCEDURES PERFORMED:  1. Left hemicolectomy.  2. Repair of ventral hernia.  3. Left nephrectomy (dictated separately by Dr. Brunilda Payor).   INDICATIONS:  The patient is a 60 year old female who is morbidly obese  who has had a year of crampy abdominal pain.  She noted change in her  bowel habits.  Dr. Elnoria Howard evaluated her with a colonoscopy.  She was noted  have a malignant stricture which was proven to be adenocarcinoma on  biopsy.  A CT scan of abdomen and pelvis showed a ventral hernia which  the patient has had for approximately 20 years.  There was also a  hypervascular lesion in the left kidney.  This lesion was further  evaluated with MRI.  She then underwent an ultrasound-guided core  biopsy.  This was shown as a renal cell carcinoma.  She has seen Dr.  Brunilda Payor and he plans a nephrectomy.   DESCRIPTION OF PROCEDURE:  The patient is brought to the operating room  and placed in supine position on operating table.  After an adequate  level of general anesthesia was obtained, the patient's abdomen was  prepped with Betadine and draped in sterile fashion.  A Foley catheter  was placed under sterile technique.  Her upper midline incision was  opened with cautery.  Dissection was carried down to the fascia.  A  hernia sac was seen just above the umbilicus.  We  dissected around this  hernia sac down to the fascia.  Fascia was opened vertically.  We were  able to identify the peritoneum.  The omentum seemed to be adherent to  the anterior abdominal wall.  We were able to take this down bluntly and  with cautery.  We were able to enter a free space inferiorly.  We then  dissected the omentum off of the anterior abdominal wall with cautery.  The Bookwalter retractor was then inserted.  The patient was tilted to  her left.  The malignant lesion was palpated in the distal transverse  colon.  There was tattooing just distal to the tumor.  We began by  mobilizing the descending colon.  We retracted this proximally.  We  continued this up to the splenic flexure.  The transverse colon was then  mobilized.  We entered the lesser sac.  Some the omentum seemed to be  densely adherent to the tumor.  This area was taken  with a LigaSure  device.  The omentum was left attached to the tumor itself.  We  continued working up around the splenic flexure.  Once the splenic  flexure was fully mobilized we divided the transverse colon in its  midportion.  There was a very large appendices epiploica which had  previously been stuck in the ventral hernia.  This was included in the  specimen.  The transverse colon was divided with Endo-GIA stapler.  The  mid descending colon was also divided with Endo-GIA stapler.  The  LigaSure device was then used to take the mesentery.  This was oriented  with a suture on the distal margin.  This was sent for pathologic  examination.  We then irrigated the left upper quadrant.  The Bookwalter  retractors were replaced to expose left kidney.  At this time Dr. Brunilda Payor  came in to perform a left nephrectomy.  He will dictate this separately.   After the nephrectomy was complete and hemostasis was good.  We examined  both ends of our colon resection.  Both of these appeared viable.  The  left colon was very mobile and reached transverse  colon with no  difficulty.  We created a side-to-side stapled anastomosis with a GIA 75  stapler.  A reinforcing suture of 3-0 Vicryl as placed at the crotch.  The enterotomy was closed with a TA-60 stapler.  All the abdomen was  then thoroughly irrigated with saline.  All sponge, instrument and  needle counts were correct.  The abdominal wall was examined.  The  fascia actually felt fairly strong.  The hernia defect was fairly small  in the midline.  We decided to close this all primarily.  The wound was  closed with double-stranded #1 PDS sutures. Every few centimeters we  placed interrupted figure-of-eight #1 Novofil sutures.  Once the closure  was complete the subcutaneous tissues were irrigated.  Staples were used  to close the skin.  A clean dressing was applied.  The patient was  extubated and brought to recovery room in stable condition.  All sponge,  instrument, needle counts correct.      Wilmon Arms. Tsuei, M.D.  Electronically Signed     MKT/MEDQ  D:  10/04/2006  T:  10/04/2006  Job:  [id number]   cc:   Lindaann Slough, M.D.  Fax: [phone]   Jordan Hawks. Elnoria Howard, MD  Fax: [phone]

## 2010-09-30 NOTE — Op Note (Signed)
Crystal York, Crystal York                  ACCOUNT NO.:  [id number]   MEDICAL RECORD NO.:  [id number]          PATIENT TYPE:  INP   LOCATION:  1540                         FACILITY:  [facility]   PHYSICIAN:  Lindaann Slough, M.D.  DATE OF BIRTH:  April 09, 1951   DATE OF PROCEDURE:  10/04/2006  DATE OF DISCHARGE:                               OPERATIVE REPORT   PREOPERATIVE DIAGNOSIS:  Left renal cell carcinoma.   POSTOPERATIVE DIAGNOSIS:  Left renal cell carcinoma.   PROCEDURE:  Left radical nephrectomy   SURGEON:  Danae Chen, M.D.   ASSISTANT:  Wilmon Arms. Tsuei, M.D.   ANESTHESIA:  General.   INDICATIONS:  The patient is a 60 year old female who had been  complaining of intermittent abdominal pain on and off for a year.  She  was worked up by Dr. Elnoria Howard  and she had a colonoscopy for heme positive  stools.  She was found to have a malignant stricture in the transverse  colon.  A CT scan of the abdomen and pelvis showed a 3 cm hypervascular  mass in the left kidney.  Biopsy of the renal mass is positive for renal  cell carcinoma and the mass is adjacent to the renal pelvis.  She is  scheduled today for colon resection and left radical nephrectomy.   The colon resection was done by Dr. Corliss Skains.  Then before doing the colon  anastomosis,  I came in and proceeded with the radical nephrectomy.   DESCRIPTION OF PROCEDURE:  The descending colon was already mobilized.  I then dissected the hilum of the kidney and the renal vein was  identified and dissected from the surrounding tissues.  The renal artery  was then found behind the renal vein and it was then freed from the  surrounding tissues.  The renal artery was then ligated with #0 silk.  Then the renal vein was ligated with #0 silk with two sutures on the  remaining stump and also with a hemoclip was placed on the renal vein.  Then the renal vein was transected between ligatures.  Then ligation of  the renal artery was then completed and two  sutures were left on the  remaining stump of the renal artery and then the renal artery was then  ligated with hemoclip.  Then the renal artery was transected in between  ligatures.  Then with blunt and sharp dissection the perinephric fat was  dissected from the surrounding tissues.  The ureter was also identified,  freed from the surrounding tissues and ligated with hemoclips and cut in  between hemoclips.  Then the upper pole of the kidney was then dissected  and incised with the LigaSure.  And then the specimen was removed in  toto.  Hemostasis was then completed with hemoclips and electrocautery.  There was no evidence of hemorrhage at the end of the procedure.   Then the colon anastomosis was then performed by Dr. Corliss Skains and the rest  of the procedure was carried out by him.   Needle, sponge and instrument counts were correct on two occasions.   The  abdominal closure and repair of a ventral hernia were done by Dr.  Corliss Skains and he will dictate his procedure under a separate dictation.      Lindaann Slough, M.D.  Electronically Signed     MN/MEDQ  D:  10/04/2006  T:  10/05/2006  Job:  [id number]   cc:   Jordan Hawks. Elnoria Howard, MD  Fax: [phone]   Wilmon Arms. Corliss Skains, M.D.  [address] [place] [phone]  [place]

## 2010-09-30 NOTE — Op Note (Signed)
NAMEBRIGITTA, Crystal York                  ACCOUNT NO.:  [id number]   MEDICAL RECORD NO.:  [id number]          PATIENT TYPE:  AMB   LOCATION:  DAY                          FACILITY[facility]   PHYSICIAN:  Wilmon Arms. Corliss Skains, M.D. DATE OF BIRTH:  [birth year]   DATE OF PROCEDURE:  09/08/2007  DATE OF DISCHARGE:                               OPERATIVE REPORT   PREOPERATIVE DIAGNOSIS:  Colon cancer.   POSTOPERATIVE DIAGNOSIS:  Colon cancer.   PROCEDURE:  Left subclavian vein port removal.   SURGEON:  Wilmon Arms. Corliss Skains, M.D., FACS   ANESTHESIA:  Local, MAC.   INDICATIONS:  The patient is a 60 year old female who is status post  left hemicolectomy, left nephrectomy and primary ventral hernia repair.  She had colon cancer, as well as left renal cell carcinoma.  The patient  has done well with treatment.  She presents now for port removal.   DESCRIPTION OF PROCEDURE:  The patient was brought to the operating room  and placed in supine position on the operating room table.  Her left arm  was tucked.  After she was given some intravenous sedation, her left  chest was prepped with Betadine and draped in a sterile fashion.  The  area around her previous port site was infiltrated with quarter-percent  Marcaine with epinephrine.  I opened up her previous incision.  Dissection was carried down to the port with cautery.  The sutures were  removed.  The Port-A-Catheter was removed.  Pressure was held for  several minutes.  No bleeding was noted.  The wound was closed with a  deep layer of 3-0 Vicryl and a subcuticular 4-0 Monocryl.  Steri-Strips  and clean dressings were applied.  The patient was awakened and brought  to recovery room in stable condition.      Wilmon Arms. Tsuei, M.D.  Electronically Signed     MKT/MEDQ  D:  09/08/2007  T:  09/08/2007  Job:  [id number]   cc:   Lindaann Slough, M.D.  Fax: [phone]   Leighton Roach Truett Perna, M.D.  Fax: [phone]

## 2010-11-03 ENCOUNTER — Other Ambulatory Visit: Payer: Self-pay | Admitting: Gynecology

## 2010-11-03 ENCOUNTER — Other Ambulatory Visit (HOSPITAL_COMMUNITY)
Admission: RE | Admit: 2010-11-03 | Discharge: 2010-11-03 | Disposition: A | Discharge status: Home / Self Care | Payer: 59 | Source: Ambulatory Visit | Attending: Gynecology | Admitting: Gynecology

## 2010-11-03 ENCOUNTER — Encounter (INDEPENDENT_AMBULATORY_CARE_PROVIDER_SITE_OTHER): Payer: 59 | Admitting: Gynecology

## 2010-11-03 DIAGNOSIS — Z124 Encounter for screening for malignant neoplasm of cervix: Secondary | ICD-10-CM | POA: Insufficient documentation

## 2010-11-03 DIAGNOSIS — R82998 Other abnormal findings in urine: Secondary | ICD-10-CM

## 2010-11-03 DIAGNOSIS — Z01419 Encounter for gynecological examination (general) (routine) without abnormal findings: Secondary | ICD-10-CM

## 2010-11-07 ENCOUNTER — Other Ambulatory Visit: Payer: Self-pay | Admitting: Gynecology

## 2010-11-07 ENCOUNTER — Ambulatory Visit (INDEPENDENT_AMBULATORY_CARE_PROVIDER_SITE_OTHER): Payer: 59 | Admitting: Gynecology

## 2010-11-07 DIAGNOSIS — N9089 Other specified noninflammatory disorders of vulva and perineum: Secondary | ICD-10-CM

## 2010-11-07 DIAGNOSIS — N764 Abscess of vulva: Secondary | ICD-10-CM

## 2010-12-24 ENCOUNTER — Other Ambulatory Visit: Payer: Self-pay | Admitting: Physical Medicine and Rehabilitation

## 2010-12-24 DIAGNOSIS — M533 Sacrococcygeal disorders, not elsewhere classified: Secondary | ICD-10-CM

## 2010-12-25 ENCOUNTER — Ambulatory Visit
Admission: RE | Admit: 2010-12-25 | Discharge: 2010-12-25 | Disposition: A | Discharge status: Home / Self Care | Payer: 59 | Source: Ambulatory Visit | Attending: Physical Medicine and Rehabilitation | Admitting: Physical Medicine and Rehabilitation

## 2010-12-25 DIAGNOSIS — G8929 Other chronic pain: Secondary | ICD-10-CM

## 2010-12-25 MED ORDER — METHYLPREDNISOLONE ACETATE 40 MG/ML INJ SUSP (RADIOLOG
120.0000 mg | Freq: Once | INTRAMUSCULAR | Status: AC
  Administered 2010-12-25: 120 mg via INTRA_ARTICULAR

## 2010-12-25 MED ORDER — IOHEXOL 180 MG/ML  SOLN
1.0000 mL | Freq: Once | INTRAMUSCULAR | Status: AC | PRN
  Administered 2010-12-25: 1 mL via INTRA_ARTICULAR

## 2010-12-26 ENCOUNTER — Emergency Department (HOSPITAL_BASED_OUTPATIENT_CLINIC_OR_DEPARTMENT_OTHER)
Admission: EM | Admit: 2010-12-26 | Discharge: 2010-12-27 | Disposition: A | Discharge status: Home / Self Care | Payer: 59 | Attending: Emergency Medicine | Admitting: Emergency Medicine

## 2010-12-26 ENCOUNTER — Encounter (HOSPITAL_BASED_OUTPATIENT_CLINIC_OR_DEPARTMENT_OTHER): Payer: Self-pay | Admitting: Emergency Medicine

## 2010-12-26 DIAGNOSIS — M549 Dorsalgia, unspecified: Secondary | ICD-10-CM | POA: Insufficient documentation

## 2010-12-26 DIAGNOSIS — M5430 Sciatica, unspecified side: Secondary | ICD-10-CM | POA: Insufficient documentation

## 2010-12-26 MED ORDER — ONDANSETRON HCL 4 MG/2ML IJ SOLN
4.0000 mg | Freq: Once | INTRAMUSCULAR | Status: AC
  Administered 2010-12-26: 4 mg via INTRAVENOUS
  Filled 2010-12-26: qty 2

## 2010-12-26 MED ORDER — HYDROMORPHONE HCL 1 MG/ML IJ SOLN
1.0000 mg | Freq: Once | INTRAMUSCULAR | Status: AC
  Administered 2010-12-26: 1 mg via INTRAVENOUS
  Filled 2010-12-26: qty 1

## 2010-12-26 NOTE — ED Notes (Signed)
Pt c/o lower back pain radiating to right leg. Pt has hx of chronic back pain, had cortizone injection yesterday. Pt reports tonight she had a coughing episode and sudden onset of severe lower back pain radiating to right leg.

## 2010-12-27 MED ORDER — HYDROMORPHONE HCL 2 MG/ML IJ SOLN
2.0000 mg | Freq: Once | INTRAMUSCULAR | Status: AC
  Administered 2010-12-27: 2 mg via INTRAVENOUS
  Filled 2010-12-27: qty 1

## 2010-12-27 MED ORDER — ONDANSETRON HCL 4 MG/2ML IJ SOLN
4.0000 mg | Freq: Once | INTRAMUSCULAR | Status: AC
  Administered 2010-12-27: 4 mg via INTRAVENOUS
  Filled 2010-12-27: qty 2

## 2010-12-27 MED ORDER — ONDANSETRON 8 MG PO TBDP
ORAL_TABLET | ORAL | Status: AC
  Filled 2010-12-27: qty 1

## 2010-12-27 MED ORDER — ONDANSETRON 8 MG PO TBDP
8.0000 mg | ORAL_TABLET | Freq: Once | ORAL | Status: AC
  Administered 2010-12-27: 8 mg via ORAL

## 2010-12-27 NOTE — ED Notes (Signed)
Pt assisted to chair. Pt is able to ambulate with assistance. Pt still nauseated. zofran given.

## 2010-12-27 NOTE — ED Provider Notes (Signed)
History     CSN: [id number] Arrival date & time: 12/26/2010 10:12 PM  Chief Complaint  Patient presents with  . Back Pain   HPI Complaints of back pain and low back radiating to right foot for past 20 years patient has taken hydrocodone with partial relief states her pain was improved upon arrival here but is starting to get worse again. Patient had spinous injection yesterday. Denies fever not denies incontinence pain is identical to her chronic back pain  Past Medical History  Diagnosis Date  . Postmenopausal bleeding   . Endometrial polyp   . Renal cancer   . Colon cancer   . Colon cancer     Past Surgical History  Procedure Date  . Hysteroscopy 10/2009  . Hand surgery     carpel tunnel surgery both hands  . Gastric bypass 1982  . Kidney surgery   . Colon surgery   . Hernia repair 09/2006  . Tonsillectomy 09/1969  . Hemorrhoid surgery 09/1969  . Tubal ligation   . Nephrectomy   . Bariatric surgery   . Carpal tunnel release   . Hemorrhoid surgery     Family History  Problem Relation Age of Onset  . Diabetes Mother   . Hypertension Mother   . Colon cancer Sister   . Lung cancer Sister     History  Substance Use Topics  . Smoking status: Former Games developer  . Smokeless tobacco: Not on file  . Alcohol Use: No    OB History    Grav Para Term Preterm Abortions TAB SAB Ect Mult Living                  Review of Systems  Constitutional: Negative.   HENT: Negative.   Respiratory: Negative.   Cardiovascular: Negative.   Gastrointestinal: Negative.   Musculoskeletal: Positive for back pain.  Skin: Negative.   Neurological: Negative.   Hematological: Negative.   Psychiatric/Behavioral: Negative.     Physical Exam  BP 132/64  Pulse 80  Temp(Src) 98.2 F (36.8 C) (Oral)  Resp 17  SpO2 95%  Physical Exam  Constitutional: She appears well-developed and well-nourished.  HENT:  Head: Normocephalic and atraumatic.  Eyes: Conjunctivae are normal. Pupils are  equal, round, and reactive to light.  Neck: Neck supple. No tracheal deviation present. No thyromegaly present.  Cardiovascular: Normal rate and regular rhythm.   No murmur heard. Pulmonary/Chest: Effort normal and breath sounds normal.  Abdominal: Soft. Bowel sounds are normal. She exhibits no distension. There is no tenderness.       Morbidly obese  Musculoskeletal: Normal range of motion. She exhibits no edema and no tenderness.  Neurological: She is alert. She displays normal reflexes. No cranial nerve deficit. Coordination normal.  Skin: Skin is warm and dry. No rash noted.  Psychiatric: She has a normal mood and affect.    ED Course  Procedures  MDM After medication with the Dilaudud and Zofran patient feels improved and ready to go home she ambulates with minimal assistance (walks with cane at baseline) plan patient has hydro codone which she can take at home followup with Dr. Madelin Headings, MD 12/27/10 [phone]

## 2011-02-10 LAB — HEMOGLOBIN AND HEMATOCRIT, BLOOD: Hemoglobin: 11.8 — ABNORMAL LOW

## 2011-03-02 ENCOUNTER — Other Ambulatory Visit: Payer: Self-pay | Admitting: Oncology

## 2011-03-02 ENCOUNTER — Encounter (HOSPITAL_BASED_OUTPATIENT_CLINIC_OR_DEPARTMENT_OTHER): Payer: 59 | Admitting: Oncology

## 2011-03-02 DIAGNOSIS — C189 Malignant neoplasm of colon, unspecified: Secondary | ICD-10-CM

## 2011-03-02 DIAGNOSIS — N289 Disorder of kidney and ureter, unspecified: Secondary | ICD-10-CM

## 2011-03-02 DIAGNOSIS — D649 Anemia, unspecified: Secondary | ICD-10-CM

## 2011-03-02 DIAGNOSIS — R21 Rash and other nonspecific skin eruption: Secondary | ICD-10-CM

## 2011-03-02 DIAGNOSIS — M545 Low back pain: Secondary | ICD-10-CM

## 2011-03-02 DIAGNOSIS — G8929 Other chronic pain: Secondary | ICD-10-CM

## 2011-03-02 LAB — CEA: CEA: 0.6 ng/mL (ref 0.0–5.0)

## 2011-03-04 LAB — APTT: aPTT: 29

## 2011-03-26 ENCOUNTER — Telehealth: Payer: Self-pay | Admitting: *Deleted

## 2011-03-26 NOTE — Telephone Encounter (Signed)
CALLED PATIENT AND INFORMED PATIENT OF THE NEW DATE AND TIME OF THE APPOINTMENT IN 2013.

## 2011-05-19 HISTORY — PX: OTHER SURGICAL HISTORY: SHX169

## 2011-08-31 ENCOUNTER — Other Ambulatory Visit: Payer: Self-pay | Admitting: *Deleted

## 2011-08-31 DIAGNOSIS — C189 Malignant neoplasm of colon, unspecified: Secondary | ICD-10-CM

## 2011-09-01 ENCOUNTER — Ambulatory Visit: Payer: 59 | Admitting: Lab

## 2011-09-01 ENCOUNTER — Telehealth: Payer: Self-pay | Admitting: Oncology

## 2011-09-01 ENCOUNTER — Ambulatory Visit (HOSPITAL_BASED_OUTPATIENT_CLINIC_OR_DEPARTMENT_OTHER): Payer: 59 | Admitting: Oncology

## 2011-09-01 VITALS — BP 132/75 | HR 77 | Temp 97.5°F | Ht 66.5 in | Wt 331.3 lb

## 2011-09-01 DIAGNOSIS — C189 Malignant neoplasm of colon, unspecified: Secondary | ICD-10-CM

## 2011-09-01 NOTE — Progress Notes (Signed)
OFFICE PROGRESS NOTE   INTERVAL HISTORY:   She returns as scheduled. She denies difficulty with bowel or bladder function. She continues to have intermittent vaginal bleeding. She is followed by Dr. Audie Box. The chronic back pain is unchanged. She saw Dr. Brunilda Payor last week and reports a restaging CT will be scheduled for next year.  Objective:  Vital signs in last 24 hours:  Blood pressure 132/75, pulse 77, temperature 97.5 F (36.4 C), height 5' 6.5" (1.689 m), weight 331 lb 4.8 oz (150.277 kg).    HEENT: Neck without mass Lymphatics: No cervical, supraclavicular, axillary, or inguinal nodes Resp: Lungs clear bilaterally Cardio: Regular rate and rhythm GI: No hepatomegaly, no mass Vascular: Pitting edema with confluent erythema at the low leg bilaterally. I cannot palpate a carotid pulses, no bruit     Lab Results:  CEA pending    Medications: I have reviewed the patient's current medications.  Assessment/Plan: 1. 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  Restaging CT evaluation 07/31/2009 showed no evidence of recurrent colon cancer.  Restaging CT evaluation 08/01/2010 showed no evidence of recurrent colon cancer. 2. Status post colonoscopy by Dr. Elnoria Howard 10/02/2008 with findings of a sessile polyp in the ascending colon and small hemorrhoids. The pathology from the polyp confirmed a tubular adenoma. Next colonoscopy will be at a 5-year interval. 3. History of oxaliplatin neuropathy. 4. History of anemia. 5. Enlarged prevascular lymph node on CT 11/03/2006, stable on CT scans April 2009, March 2010 and March 2011. 6. History of renal insufficiency. 7. History of an elevated preoperative CEA.  CEA was normal on 03/02/2011. 8. 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  She continues to follow up with Dr. Brunilda Payor. 9. "Cystic" lesion of the subcutaneous tissue at the right lumbar level.  A  cyst has been present in this area dating back to CT scans from 2008.  This is felt to likely be a benign finding.   Disposition:  She remains in clinical remission from colon cancer. We will followup on the CEA from today. She will return for an office visit and CEA in one year.  She will continue surveillance colonoscopies with Dr. Elnoria Howard, next scheduled for 2015.   Lucile Shutters, MD  09/01/2011  12:14 PM

## 2011-09-01 NOTE — Telephone Encounter (Signed)
appts made and printed for pt aom °

## 2011-09-07 ENCOUNTER — Telehealth: Payer: Self-pay | Admitting: *Deleted

## 2011-09-07 NOTE — Telephone Encounter (Signed)
Message copied by Wandalee Ferdinand on Mon Sep 07, 2011  6:29 PM ------      Message from: Thornton Papas B      Created: Wed Sep 02, 2011  9:04 PM       Please call patient, CEA is normal

## 2011-09-07 NOTE — Telephone Encounter (Signed)
Patient notified of CEA results 

## 2012-04-26 ENCOUNTER — Encounter: Payer: Self-pay | Admitting: Gynecology

## 2012-04-26 ENCOUNTER — Ambulatory Visit (INDEPENDENT_AMBULATORY_CARE_PROVIDER_SITE_OTHER): Payer: 59 | Admitting: Gynecology

## 2012-04-26 VITALS — BP 114/72 | Ht 64.0 in | Wt 330.0 lb

## 2012-04-26 DIAGNOSIS — Z13220 Encounter for screening for lipoid disorders: Secondary | ICD-10-CM

## 2012-04-26 DIAGNOSIS — Z01419 Encounter for gynecological examination (general) (routine) without abnormal findings: Secondary | ICD-10-CM

## 2012-04-26 DIAGNOSIS — R21 Rash and other nonspecific skin eruption: Secondary | ICD-10-CM

## 2012-04-26 DIAGNOSIS — N950 Postmenopausal bleeding: Secondary | ICD-10-CM

## 2012-04-26 DIAGNOSIS — N952 Postmenopausal atrophic vaginitis: Secondary | ICD-10-CM

## 2012-04-26 LAB — COMPREHENSIVE METABOLIC PANEL
ALT: 17 U/L (ref 0–35)
AST: 14 U/L (ref 0–37)
Alkaline Phosphatase: 62 U/L (ref 39–117)
BUN: 25 mg/dL — ABNORMAL HIGH (ref 6–23)
Creat: 1.02 mg/dL (ref 0.50–1.10)

## 2012-04-26 LAB — LIPID PANEL
HDL: 60 mg/dL (ref >39)
LDL Cholesterol: 117 mg/dL — ABNORMAL HIGH (ref 0–99)
Total CHOL/HDL Ratio: 3.3 Ratio
VLDL: 22 mg/dL (ref 0–40)

## 2012-04-26 LAB — CBC WITH DIFFERENTIAL/PLATELET
Basophils Absolute: 0 10*3/uL (ref 0.0–0.1)
Basophils Relative: 0 % (ref 0–1)
Eosinophils Relative: 2 % (ref 0–5)
HCT: 37.9 % (ref 36.0–46.0)
MCHC: 33 g/dL (ref 30.0–36.0)
MCV: 85.4 fL (ref 78.0–100.0)
Monocytes Absolute: 0.7 10*3/uL (ref 0.1–1.0)
Neutro Abs: 9.1 10*3/uL — ABNORMAL HIGH (ref 1.7–7.7)
Platelets: 197 10*3/uL (ref 150–400)
RDW: 14.9 % (ref 11.5–15.5)
WBC: 12.2 10*3/uL — ABNORMAL HIGH (ref 4.0–10.5)

## 2012-04-26 MED ORDER — NYSTATIN-TRIAMCINOLONE 100000-0.1 UNIT/GM-% EX OINT: TOPICAL_OINTMENT | Freq: Two times a day (BID) | CUTANEOUS | Status: DC

## 2012-04-26 NOTE — Patient Instructions (Signed)
Call to Schedule your mammogram  Facilities in [place]: 1)  [facility], [address] Phone: [phone] 2)  [facility], [address] Phone: [phone] 3)  Dr. Bertrand at [facility]  [address] Phone: [phone]     Mammogram A mammogram is an X-ray test to find changes in a woman's breast. You should get a mammogram if:  You are 40 years of age or older  You have risk factors.   Your doctor recommends that you have one.  BEFORE THE TEST  Do not schedule the test the week before your period, especially if your breasts are sore during this time.  On the day of your mammogram:  Wash your breasts and armpits well. After washing, do not put on any deodorant or talcum powder on until after your test.   Eat and drink as you usually do.   Take your medicines as usual.   If you are diabetic and take insulin, make sure you:   Eat before coming for your test.   Take your insulin as usual.   If you cannot keep your appointment, call before the appointment to cancel. Schedule another appointment.  TEST  You will need to undress from the waist up. You will put on a hospital gown.   Your breast will be put on the mammogram machine, and it will press firmly on your breast with a piece of plastic called a compression paddle. This will make your breast flatter so that the machine can X-ray all parts of your breast.   Both breasts will be X-rayed. Each breast will be X-rayed from above and from the side. An X-ray might need to be taken again if the picture is not good enough.   The mammogram will last about 15 to 30 minutes.  AFTER THE TEST Finding out the results of your test Ask when your test results will be ready. Make sure you get your test results.  Document Released: 07/31/2008 Document Revised: 04/23/2011 Document Reviewed: 07/31/2008 [employer] Patient  Information 2012 [employer]   

## 2012-04-26 NOTE — Progress Notes (Signed)
SHANYCE DARIS Apr 03, 1951 [id number]        61 y.o.  G2P2 for annual exam.  With issues noted below.  Past medical history,surgical history, medications, allergies, family history and social history were all reviewed and documented in the EPIC chart. ROS:  Was performed and pertinent positives and negatives are included in the history.  Exam: Sherrilyn Rist assistant Filed Vitals:   04/26/12 1207  BP: 114/72  Height: 5\' 4"  (1.626 m)  Weight: 330 lb (149.687 kg)   General appearance  Normal Skin grossly normal Head/Neck normal with no cervical or supraclavicular adenopathy thyroid normal Lungs  clear Cardiac RR, without RMG Abdominal  soft, nontender, without masses, organomegaly or hernia Breasts  examined lying and sitting without masses, retractions, discharge or axillary adenopathy. Pelvic  Ext/BUS/vagina  With fungal type rash between thighs and panniculus, atrophic genital changes  Cervix  normal atrophic grossly normal  Uterus  Grossly normal in size exam limited by abdominal girth  Adnexa  Without gross masses or tenderness    Anus and perineum  normal   Rectovaginal  normal sphincter tone without palpated masses or tenderness.    Assessment/Plan:  61 y.o. G2P2 female for annual exam.   1. Postmenopausal bleeding. Patient notes last week she had a menses type bleed that lasted for several days. similar episode in February 2013.  Does have a history of endometrial polyps in 2011 due to postmenopausal bleeding. Exam is grossly normal although limited by abdominal girth. Plan sonohysterogram and patient will schedule. 2. Rash. Patient has classic fungal rash in her groin and panniculus. We'll treat with Mytrex cream when necessary as needed. 3. Mammography. It has been a number of years since her mammogram. I stressed the importance of doing so and the benefit of early detection. Patient promises to schedule. SBE monthly reviewed. 4. Pap smear 2012. No Pap smear done today. No history of  abnormal Pap smears. Plan less frequent screening every 3 years. 5. Colonoscopy. Patients actively being followed given her history of colon cancer and renal cancer. We'll follow up with her oncologist as scheduled. 6. Health maintenance. Patient is asked I would do blood work for her. Has not seen her primary recently and is planning to see her oncologist after the first of the year and wanted blood work done. I ordered a CBC comprehensive metabolic panel lipid profile vitamin D and TSH.  Patient will follow for sonohysterogram.    Dara Lords MD, 12:41 PM 04/26/2012

## 2012-04-27 LAB — URINALYSIS W MICROSCOPIC + REFLEX CULTURE
Casts: NONE SEEN
Crystals: NONE SEEN
Glucose, UA: NEGATIVE mg/dL
Hgb urine dipstick: NEGATIVE
Ketones, ur: NEGATIVE mg/dL
Nitrite: NEGATIVE
Specific Gravity, Urine: 1.024 (ref 1.005–1.030)
pH: 6 (ref 5.0–8.0)

## 2012-04-27 LAB — TSH: TSH: 2.376 u[IU]/mL (ref 0.350–4.500)

## 2012-04-28 LAB — URINE CULTURE

## 2012-05-05 ENCOUNTER — Other Ambulatory Visit: Payer: Self-pay | Admitting: Gynecology

## 2012-05-05 DIAGNOSIS — N950 Postmenopausal bleeding: Secondary | ICD-10-CM

## 2012-05-06 ENCOUNTER — Ambulatory Visit (INDEPENDENT_AMBULATORY_CARE_PROVIDER_SITE_OTHER): Payer: 59 | Admitting: Gynecology

## 2012-05-06 ENCOUNTER — Encounter: Payer: Self-pay | Admitting: Gynecology

## 2012-05-06 ENCOUNTER — Ambulatory Visit (INDEPENDENT_AMBULATORY_CARE_PROVIDER_SITE_OTHER): Payer: 59

## 2012-05-06 DIAGNOSIS — N950 Postmenopausal bleeding: Secondary | ICD-10-CM

## 2012-05-06 DIAGNOSIS — E559 Vitamin D deficiency, unspecified: Secondary | ICD-10-CM

## 2012-05-06 NOTE — Progress Notes (Signed)
Patient presents for sonohysterogram with history of postmenopausal bleeding. Had hysteroscopy D&C 2011 for endometrial polyps. Ultrasound initially shows uterus normal size and echotexture. Endometrial echo 3.7 mm. Ovaries were not visualized but there were no adnexal pathology noted. Sonohysterogram performed, sterile technique, easy catheter introduction, good distention with no abnormalities. Endometrial biopsy with scant return taken.  Assessment and plan: Postmenopausal bleeding negative sonohysterogram. If biopsy negative or in adequate plan expected management. Patient to report any further bleeding. I did review her lab studies from her recent visit which overall looked good. She did have a low vitamin D of 18 I recommended to thousand units daily with recheck in several months. She's also going to show her BUN valued to her renal doctor who follows her because of her one kidney and history of renal carcinoma.

## 2012-05-06 NOTE — Patient Instructions (Addendum)
Report any further vaginal bleeding. Increase vitamin D to 2000 units daily.

## 2012-05-09 ENCOUNTER — Encounter: Payer: Self-pay | Admitting: Gynecology

## 2012-05-13 ENCOUNTER — Other Ambulatory Visit: Payer: Self-pay | Admitting: Gynecology

## 2012-05-13 ENCOUNTER — Telehealth: Payer: Self-pay | Admitting: Gynecology

## 2012-05-13 ENCOUNTER — Other Ambulatory Visit: Payer: 59

## 2012-05-13 DIAGNOSIS — N926 Irregular menstruation, unspecified: Secondary | ICD-10-CM

## 2012-05-13 DIAGNOSIS — C189 Malignant neoplasm of colon, unspecified: Secondary | ICD-10-CM

## 2012-05-13 LAB — HCG, SERUM, QUALITATIVE: Preg, Serum: NEGATIVE

## 2012-05-13 NOTE — Telephone Encounter (Signed)
Pt informed with the below, she will come in for blood work.

## 2012-05-13 NOTE — Telephone Encounter (Signed)
Tell patient that the biopsy showed benign endometrial tissue at this point I just want to watch her mostly when she does from a bleeding standpoint. There apparently was some cross contamination from a prior specimen run through the machine at the pathology department because it did show pregnancy type tissue which obviously is not an issue with her. There are rare or tumors that do produce pregnancy type tissue and for completeness I want her to check a beta hCG and have her come by her convenience to have this drawn. Otherwise we will just follow her bleeding pattern at this time.

## 2012-05-13 NOTE — Telephone Encounter (Signed)
Left message for pt to call.

## 2012-06-02 ENCOUNTER — Telehealth: Payer: Self-pay | Admitting: *Deleted

## 2012-06-02 DIAGNOSIS — E569 Vitamin deficiency, unspecified: Secondary | ICD-10-CM

## 2012-06-02 NOTE — Telephone Encounter (Signed)
Pt informed with negative HCG serum test on 05/13/12.

## 2012-09-02 ENCOUNTER — Other Ambulatory Visit: Payer: Self-pay | Admitting: *Deleted

## 2012-09-02 ENCOUNTER — Ambulatory Visit (HOSPITAL_BASED_OUTPATIENT_CLINIC_OR_DEPARTMENT_OTHER): Payer: Medicare HMO | Admitting: Oncology

## 2012-09-02 ENCOUNTER — Other Ambulatory Visit: Payer: Medicare HMO | Admitting: Lab

## 2012-09-02 ENCOUNTER — Telehealth: Payer: Self-pay | Admitting: Oncology

## 2012-09-02 VITALS — BP 136/70 | HR 79 | Temp 97.3°F | Resp 20 | Ht 64.0 in | Wt 345.8 lb

## 2012-09-02 DIAGNOSIS — C189 Malignant neoplasm of colon, unspecified: Secondary | ICD-10-CM

## 2012-09-02 DIAGNOSIS — M549 Dorsalgia, unspecified: Secondary | ICD-10-CM

## 2012-09-02 DIAGNOSIS — G8929 Other chronic pain: Secondary | ICD-10-CM

## 2012-09-02 DIAGNOSIS — C649 Malignant neoplasm of unspecified kidney, except renal pelvis: Secondary | ICD-10-CM

## 2012-09-02 NOTE — Progress Notes (Signed)
   [facility]    OFFICE PROGRESS NOTE   INTERVAL HISTORY:   She returns as scheduled. Her chief complaint is chronic back pain. She has received steroid injections by orthopedics. She reports undergoing restaging CT scans at the urology Center within the past few weeks. She is followed by Dr. Audie Box for evaluation of vaginal bleeding.  Objective:  Vital signs in last 24 hours:  Blood pressure 136/70, pulse 79, temperature 97.3 F (36.3 C), temperature source Oral, resp. rate 20, height 5\' 4"  (1.626 m), weight 345 lb 12.8 oz (156.854 kg), last menstrual period 10/01/2010.    HEENT: Neck without mass Lymphatics: No cervical, supraclavicular, axillary, or inguinal nodes Resp: Lungs clear bilateral Cardio: Regular rate and rhythm GI: No hepatomegaly, no mass Vascular: Chronic stasis change with trace edema at the low leg bilaterally   Muscle skeletal: 2 mm round mobile cystic area at the dorsum of the right hand    Lab Results:  CEA pending   Medications: I have reviewed the patient's current medications.  Assessment/Plan: 1. 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 Restaging CT evaluation 07/31/2009 showed no evidence of recurrent colon cancer. Restaging CT evaluation 08/01/2010 showed no evidence of recurrent colon cancer. I will followup on the staging CTs from April of 2014 to 2. Status post colonoscopy by Dr. Elnoria Howard 10/02/2008 with findings of a sessile polyp in the ascending colon and small hemorrhoids. The pathology from the polyp confirmed a tubular adenoma. Next colonoscopy will be at a 5-year interval. 3. History of oxaliplatin neuropathy. 4. History of anemia. 5. Enlarged prevascular lymph node on CT 11/03/2006, stable on CT scans April 2009, March 2010 and March 2011. 6. History of renal insufficiency. 7. History of an elevated preoperative CEA. CEA was normal on  09/01/2011. 8. 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 She continues to follow up with Dr. Brunilda Payor. 9. "Cystic" lesion of the subcutaneous tissue at the right lumbar level. A cyst has been present in this area dating back to CT scans from 2008. This is felt to likely be a benign finding. 10. Chronic back pain-followed by orthopedics  Disposition:  She remains in clinical remission from colon cancer and renal cell carcinoma. She plans to continue colonoscopy followup with Dr. Elnoria Howard. Ms. Innes would like to continue yearly followup at the cancer Center. She will return for an office and CEA in one year. We will followup on the recent CTs from the urology Center.   Thornton Papas, MD  09/02/2012  3:07 PM

## 2012-09-02 NOTE — Telephone Encounter (Signed)
gv pt appt schedule for April 2015.  °

## 2012-09-06 ENCOUNTER — Telehealth: Payer: Self-pay | Admitting: *Deleted

## 2012-09-06 NOTE — Telephone Encounter (Signed)
Message copied by Caleb Popp on Tue Sep 06, 2012 12:43 PM ------      Message from: Ladene Artist      Created: Sat Sep 03, 2012  4:47 PM       Please call patient, cea is normal ------

## 2012-09-06 NOTE — Telephone Encounter (Signed)
Called pt with lab results. She voiced understanding.  

## 2013-09-01 ENCOUNTER — Other Ambulatory Visit (HOSPITAL_BASED_OUTPATIENT_CLINIC_OR_DEPARTMENT_OTHER): Payer: Commercial Managed Care - HMO

## 2013-09-01 ENCOUNTER — Telehealth: Payer: Self-pay | Admitting: Oncology

## 2013-09-01 ENCOUNTER — Ambulatory Visit (HOSPITAL_BASED_OUTPATIENT_CLINIC_OR_DEPARTMENT_OTHER): Payer: Medicare HMO | Admitting: Nurse Practitioner

## 2013-09-01 VITALS — BP 148/79 | HR 80 | Temp 97.6°F | Resp 19 | Ht 64.0 in | Wt 355.7 lb

## 2013-09-01 DIAGNOSIS — C189 Malignant neoplasm of colon, unspecified: Secondary | ICD-10-CM

## 2013-09-01 DIAGNOSIS — Z85038 Personal history of other malignant neoplasm of large intestine: Secondary | ICD-10-CM

## 2013-09-01 DIAGNOSIS — Z85528 Personal history of other malignant neoplasm of kidney: Secondary | ICD-10-CM

## 2013-09-01 NOTE — Telephone Encounter (Signed)
Gave pt appt for lab and Md for next year 2016

## 2013-09-01 NOTE — Progress Notes (Signed)
  [facility] OFFICE PROGRESS NOTE   Diagnosis:  History of stage III colon cancer.  INTERVAL HISTORY:   Crystal York returns as scheduled. No change in bowel habits. No bloody or black stools. She denies abdominal pain. She has occasional nausea. She reports a good appetite. About one month ago she noted a small amount of blood from the "navel". She has had a few more episodes.  Objective:  Vital signs in last 24 hours:  Blood pressure 148/79, pulse 80, temperature 97.6 F (36.4 C), temperature source Oral, resp. rate 19, height 5\' 4"  (1.626 m), weight 355 lb 11.2 oz (161.344 kg), last menstrual period 10/01/2010.    HEENT: No thrush or ulcerations. Lymphatics: No palpable cervical, supraclavicular, axillary or inguinal lymph nodes. Resp: Lungs clear. Cardio: Regular cardiac rhythm. GI: Abdomen is obese. No obvious organomegaly. Vascular: Pitting edema at the lower legs bilaterally with chronic stasis changes. Skin: umbilicus with small amount of watery blood tinged drainage. No obvious source.   Lab Results:  CEA pending   Medications: I have reviewed the patient's current medications.  Assessment/Plan: 1. 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 Restaging CT evaluation 07/31/2009 showed no evidence of recurrent colon cancer. Restaging CT evaluation 08/01/2010 showed no evidence of recurrent colon cancer.  2. Status post colonoscopy by Dr. Benson Norway 10/02/2008 with findings of a sessile polyp in the ascending colon and small hemorrhoids. The pathology from the polyp confirmed a tubular adenoma. Next colonoscopy will be at a 5-year interval. 3. History of oxaliplatin neuropathy. 4. History of anemia. 5. Enlarged prevascular lymph node on CT 11/03/2006, stable on CT scans April 2009, March 2010 and March 2011. 6. History of renal insufficiency. 7. History of an elevated preoperative CEA. CEA was  normal on 09/02/2012. 8. 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 She continues to follow up with Dr. Janice Norrie. 9. "Cystic" lesion of the subcutaneous tissue at the right lumbar level. A cyst has been present in this area dating back to CT scans from 2008. This is felt to likely be a benign finding. 10. Chronic back pain-followed by orthopedics. 11. One month history of intermittent bleeding from umbilicus.    Disposition: She remains in clinical remission from colon cancer and renal cell carcinoma. She plans to contact Dr. Benson Norway to schedule colonoscopy followup. She will return for a followup visit here with a CEA in one year.  Dr. Benay Spice recommends she followup with Dr. Gershon Crane if the bleeding from the umbilicus persists.  Patient seen with Dr. Benay Spice.    Crystal York ANP/GNP-BC   09/01/2013  12:03 PM  This was a shared visit with Ned Card Examination of the umbilicus was unremarkable.  Crystal York, M.D.

## 2013-09-02 LAB — CEA: CEA: 0.6 ng/mL (ref 0.0–5.0)

## 2013-09-04 ENCOUNTER — Telehealth: Payer: Self-pay | Admitting: *Deleted

## 2013-09-04 NOTE — Telephone Encounter (Signed)
Per Dr. Sherrill; notified pt that cea is normal.  Pt verbalized understanding. 

## 2013-09-04 NOTE — Telephone Encounter (Signed)
Message copied by Dina Rich Jalaine Riggenbach P on Mon Sep 04, 2013 10:21 AM ------      Message from: Betsy Coder B      Created: Mon Sep 04, 2013  7:45 AM       Please call patient, cea is normal ------

## 2013-10-30 ENCOUNTER — Other Ambulatory Visit: Payer: Self-pay | Admitting: Gastroenterology

## 2013-10-31 ENCOUNTER — Encounter (HOSPITAL_COMMUNITY): Payer: Self-pay | Admitting: *Deleted

## 2013-10-31 ENCOUNTER — Encounter (HOSPITAL_COMMUNITY): Payer: Self-pay | Admitting: Pharmacy Technician

## 2013-11-03 ENCOUNTER — Encounter (HOSPITAL_COMMUNITY)
Admission: RE | Disposition: A | Discharge status: Home / Self Care | Payer: Self-pay | Source: Ambulatory Visit | Attending: Gastroenterology

## 2013-11-03 ENCOUNTER — Ambulatory Visit (HOSPITAL_COMMUNITY): Payer: Medicare HMO | Admitting: Anesthesiology

## 2013-11-03 ENCOUNTER — Encounter (HOSPITAL_COMMUNITY): Payer: Medicare HMO | Admitting: Anesthesiology

## 2013-11-03 ENCOUNTER — Ambulatory Visit (HOSPITAL_COMMUNITY)
Admission: RE | Admit: 2013-11-03 | Discharge: 2013-11-03 | Disposition: A | Discharge status: Home / Self Care | Payer: Medicare HMO | Source: Ambulatory Visit | Attending: Gastroenterology | Admitting: Gastroenterology

## 2013-11-03 DIAGNOSIS — Z85528 Personal history of other malignant neoplasm of kidney: Secondary | ICD-10-CM | POA: Insufficient documentation

## 2013-11-03 DIAGNOSIS — Z09 Encounter for follow-up examination after completed treatment for conditions other than malignant neoplasm: Secondary | ICD-10-CM | POA: Insufficient documentation

## 2013-11-03 DIAGNOSIS — Z9884 Bariatric surgery status: Secondary | ICD-10-CM | POA: Insufficient documentation

## 2013-11-03 DIAGNOSIS — M545 Low back pain, unspecified: Secondary | ICD-10-CM | POA: Insufficient documentation

## 2013-11-03 DIAGNOSIS — Z85038 Personal history of other malignant neoplasm of large intestine: Secondary | ICD-10-CM | POA: Insufficient documentation

## 2013-11-03 DIAGNOSIS — K5730 Diverticulosis of large intestine without perforation or abscess without bleeding: Secondary | ICD-10-CM | POA: Insufficient documentation

## 2013-11-03 DIAGNOSIS — Z9221 Personal history of antineoplastic chemotherapy: Secondary | ICD-10-CM | POA: Insufficient documentation

## 2013-11-03 DIAGNOSIS — Z923 Personal history of irradiation: Secondary | ICD-10-CM | POA: Insufficient documentation

## 2013-11-03 DIAGNOSIS — Z905 Acquired absence of kidney: Secondary | ICD-10-CM | POA: Insufficient documentation

## 2013-11-03 DIAGNOSIS — Z87891 Personal history of nicotine dependence: Secondary | ICD-10-CM | POA: Insufficient documentation

## 2013-11-03 HISTORY — DX: Other chronic pain: G89.29

## 2013-11-03 HISTORY — DX: Dorsalgia, unspecified: M54.9

## 2013-11-03 HISTORY — DX: Personal history of urinary calculi: Z87.442

## 2013-11-03 HISTORY — DX: Allergy status to unspecified drugs, medicaments and biological substances: Z88.9

## 2013-11-03 HISTORY — PX: COLONOSCOPY WITH PROPOFOL: SHX5780

## 2013-11-03 SURGERY — COLONOSCOPY WITH PROPOFOL
Anesthesia: Monitor Anesthesia Care

## 2013-11-03 MED ORDER — PROPOFOL 10 MG/ML IV BOLUS
INTRAVENOUS | Status: DC | PRN
  Administered 2013-11-03: 50 mg via INTRAVENOUS
  Administered 2013-11-03 (×3): 25 mg via INTRAVENOUS
  Administered 2013-11-03: 50 mg via INTRAVENOUS

## 2013-11-03 MED ORDER — PROPOFOL 10 MG/ML IV BOLUS
INTRAVENOUS | Status: AC
  Filled 2013-11-03: qty 20

## 2013-11-03 MED ORDER — LACTATED RINGERS IV SOLN
INTRAVENOUS | Status: DC | PRN
  Administered 2013-11-03: 10:00:00 via INTRAVENOUS

## 2013-11-03 MED ORDER — LACTATED RINGERS IV SOLN
INTRAVENOUS | Status: DC
  Administered 2013-11-03: 10:00:00 via INTRAVENOUS

## 2013-11-03 MED ORDER — SODIUM CHLORIDE 0.9 % IV SOLN: INTRAVENOUS | Status: DC

## 2013-11-03 SURGICAL SUPPLY — 22 items

## 2013-11-03 NOTE — Transfer of Care (Signed)
Immediate Anesthesia Transfer of Care Note  Patient: Crystal York  Procedure(s) Performed: Procedure(s): COLONOSCOPY WITH PROPOFOL (N/A)  Patient Location: PACU  Anesthesia Type:MAC  Level of Consciousness: awake, sedated and patient cooperative  Airway & Oxygen Therapy: Patient Spontanous Breathing and Patient connected to face mask oxygen  Post-op Assessment: Report given to PACU RN and Post -op Vital signs reviewed and stable  Post vital signs: Reviewed and stable  Complications: No apparent anesthesia complications

## 2013-11-03 NOTE — Discharge Instructions (Signed)

## 2013-11-03 NOTE — Op Note (Signed)
[facility] [place], [address]   OPERATIVE PROCEDURE REPORT  PATIENT: Crystal York, Crystal York  MR#: [id number] BIRTHDATE: 31-Dec-1950  GENDER: Female ENDOSCOPIST: Carol Ada, MD ASSISTANT:   William Dalton, technician Clemmie Krill, RN, BSN PROCEDURE DATE: 11/03/2013 PROCEDURE:   Colonoscopy, diagnostic ASA CLASS:   Class III INDICATIONS: Personal history of colon cancer MEDICATIONS: MAC sedation, administered by CRNA  DESCRIPTION OF PROCEDURE:   After the risks benefits and alternatives of the procedure were thoroughly explained, informed consent was obtained.  A digital rectal exam revealed no abnormalities of the rectum.    The EC-[id number] ([id number])  endoscope was introduced through the anus  and advanced to the cecum, which was identified by both the appendix and ileocecal valve , No adverse events experienced.    The quality of the prep was good. . The instrument was then slowly withdrawn as the colon was fully examined.     FINDINGS: The transverse colon surgical anastamosis was intact.  No evidence of any polyps, masses, inflammatino, ulcerations, erosions, or vascaular abnormalities.  Scattered left-sided diverticula were identified.   Retroflexed views revealed no abnormalities.     The scope was then withdrawn from the patient and the procedure terminated.  COMPLICATIONS: There were no complications.  IMPRESSION: 1) Intact transverse colon anastamosis. 2) Diverticula.  RECOMMENDATIONS: 1) Repeat colonoscopy in 5 years.  _______________________________ eSignedCarol Ada, MD 11/03/2013 10:26 AM

## 2013-11-03 NOTE — H&P (Signed)
   Crystal York HPI: This is a 63 year old female with a PMH of colon cancer and renal cancer s/p concurrent resection on 09/24/2006.  Follow up colonoscopies since that time did not reveal any recurrence of her disease.  Overall she is well at this time.  No complaints.  Past Medical History  Diagnosis Date  . Postmenopausal bleeding   . Endometrial polyp   . H/O seasonal allergies   . History of kidney stones   . Colon cancer     '08- chemotherapy after surgery  . Colon cancer   . Renal cancer     '08 left neprectomy  . Chronic back pain     chronic pain "deteriorating"    Past Surgical History  Procedure Laterality Date  . Hysteroscopy  10/2009  . Hand surgery      carpel tunnel surgery both hands  . Gastric bypass  1982  . Kidney surgery    . Colon surgery    . Tonsillectomy  09/1969  . Hemorrhoid surgery  09/1969  . Tubal ligation    . Nephrectomy    . Bariatric surgery    . Carpal tunnel release    . Hemorrhoid surgery    . Radioactive therapy on back  2013  . Colposcopy    . Hernia repair  09/2006    Family History  Problem Relation Age of Onset  . Diabetes Mother   . Hypertension Mother   . Colon cancer Sister   . Cancer Sister     pancreatic and liver  . Lung cancer Sister   . Cancer Other     lung and colon    Social History:  reports that she quit smoking about 18 years ago. She does not have any smokeless tobacco history on file. She reports that she does not drink alcohol or use illicit drugs.  Allergies:  Allergies  Allergen Reactions  . Ciprofloxacin Rash  . Codeine Nausea And Vomiting and Rash  . Macrodantin [Nitrofurantoin] Nausea And Vomiting and Rash    Medications: Scheduled: Continuous:  No results found for this or any previous visit (from the past 24 hour(s)).   No results found.  ROS:  As stated above in the HPI otherwise negative.  Last menstrual period 10/01/2010.    PE: Gen: NAD, Alert and Oriented HEENT:  Crystal York,  EOMI Neck: Supple, no LAD Lungs: CTA Bilaterally CV: RRR without M/G/R ABM: Soft, NTND, +BS, morbidly obese. Ext: No C/C/E  Assessment/Plan: 1) Personal history of colon cancer. 2) Morbid obesity. 3) History of renal cell carcinoma.  Plan: 1) Colonoscopy today.  HUNG,PATRICK D 11/03/2013, 9:10 AM

## 2013-11-03 NOTE — Anesthesia Postprocedure Evaluation (Signed)
Anesthesia Post Note  Patient: Crystal York  Procedure(s) Performed: Procedure(s) (LRB): COLONOSCOPY WITH PROPOFOL (N/A)  Anesthesia type: MAC  Patient location: PACU  Post pain: Pain level controlled  Post assessment: Post-op Vital signs reviewed  Last Vitals: BP 119/55  Pulse 65  Temp(Src) 36.2 C (Oral)  Resp 15  SpO2 96%  LMP 10/01/2010  Post vital signs: Reviewed  Level of consciousness: awake  Complications: No apparent anesthesia complications

## 2013-11-03 NOTE — Anesthesia Preprocedure Evaluation (Addendum)
Anesthesia Evaluation  Patient identified by MRN, date of birth, ID band Patient awake    Reviewed: Allergy & Precautions, H&P , NPO status , Patient's Chart, lab work & pertinent test results  Airway Mallampati: III TM Distance: >3 FB Neck ROM: Full    Dental  (+) Dental Advisory Given   Pulmonary former smoker,  breath sounds clear to auscultation        Cardiovascular negative cardio ROS  Rhythm:Regular     Neuro/Psych negative neurological ROS  negative psych ROS   GI/Hepatic negative GI ROS, Neg liver ROS,   Endo/Other  Morbid obesity  Renal/GU Renal disease     Musculoskeletal negative musculoskeletal ROS (+)   Abdominal (+) + obese,   Peds  Hematology negative hematology ROS (+)   Anesthesia Other Findings   Reproductive/Obstetrics negative OB ROS                          Anesthesia Physical Anesthesia Plan  ASA: IV  Anesthesia Plan: MAC   Post-op Pain Management:    Induction: Intravenous  Airway Management Planned:   Additional Equipment:   Intra-op Plan:   Post-operative Plan:   Informed Consent: I have reviewed the patients History and Physical, chart, labs and discussed the procedure including the risks, benefits and alternatives for the proposed anesthesia with the patient or authorized representative who has indicated his/her understanding and acceptance.   Dental advisory given  Plan Discussed with: CRNA  Anesthesia Plan Comments:         Anesthesia Quick Evaluation

## 2013-11-06 ENCOUNTER — Encounter (HOSPITAL_COMMUNITY): Payer: Self-pay | Admitting: Gastroenterology

## 2013-11-21 ENCOUNTER — Ambulatory Visit (INDEPENDENT_AMBULATORY_CARE_PROVIDER_SITE_OTHER): Payer: Medicare PPO | Admitting: Gynecology

## 2013-11-21 ENCOUNTER — Encounter: Payer: Self-pay | Admitting: Gynecology

## 2013-11-21 ENCOUNTER — Other Ambulatory Visit (HOSPITAL_COMMUNITY)
Admission: RE | Admit: 2013-11-21 | Discharge: 2013-11-21 | Disposition: A | Discharge status: Home / Self Care | Payer: Medicare HMO | Source: Ambulatory Visit | Attending: Gynecology | Admitting: Gynecology

## 2013-11-21 VITALS — BP 126/78 | Ht 64.0 in | Wt 354.0 lb

## 2013-11-21 DIAGNOSIS — B3731 Acute candidiasis of vulva and vagina: Secondary | ICD-10-CM

## 2013-11-21 DIAGNOSIS — Z124 Encounter for screening for malignant neoplasm of cervix: Secondary | ICD-10-CM | POA: Diagnosis present

## 2013-11-21 DIAGNOSIS — N952 Postmenopausal atrophic vaginitis: Secondary | ICD-10-CM

## 2013-11-21 DIAGNOSIS — E559 Vitamin D deficiency, unspecified: Secondary | ICD-10-CM

## 2013-11-21 DIAGNOSIS — B373 Candidiasis of vulva and vagina: Secondary | ICD-10-CM

## 2013-11-21 MED ORDER — NYSTATIN 100000 UNIT/GM EX POWD: Freq: Four times a day (QID) | CUTANEOUS | Status: DC

## 2013-11-21 NOTE — Patient Instructions (Addendum)
Followup in one year, sooner as needed.  Call to Schedule your mammogram  Facilities in [place]: 1)  [facility], [place] Phone: [phone]  [facility]. [address] [place] Phone: [phone])  Dr. Isaiah Blakes at [facility] [address] Phone: [phone]     Mammogram A mammogram is an X-ray test to find changes in a woman's breast. You should get a mammogram if:  You are 12 years of age or older  You have risk factors.   Your doctor recommends that you have one.  BEFORE THE TEST  Do not schedule the test the week before your period, especially if your breasts are sore during this time.  On the day of your mammogram:  Wash your breasts and armpits well. After washing, do not put on any deodorant or talcum powder on until after your test.   Eat and drink as you usually do.   Take your medicines as usual.   If you are diabetic and take insulin, make sure you:   Eat before coming for your test.   Take your insulin as usual.   If you cannot keep your appointment, call before the appointment to cancel. Schedule another appointment.  TEST  You will need to undress from the waist up. You will put on a hospital gown.   Your breast will be put on the mammogram machine, and it will press firmly on your breast with a piece of plastic called a compression paddle. This will make your breast flatter so that the machine can X-ray all parts of your breast.   Both breasts will be X-rayed. Each breast will be X-rayed from above and from the side. An X-ray might need to be taken again if the picture is not good enough.   The mammogram will last about 15 to 30 minutes.  AFTER THE TEST Finding out the results of your test Ask when your test results will be ready. Make sure you get your test results.  Document Released: 07/31/2008 Document Revised: 04/23/2011 Document  Reviewed: 07/31/2008 [facility] Patient Information 2012 [place].    You may obtain a copy of any labs that were done today by logging onto MyChart as outlined in the instructions provided with your AVS (after visit summary). The office will not call with normal lab results but certainly if there are any significant abnormalities then we will contact you.   Health Maintenance, Female A healthy lifestyle and preventative care can promote health and wellness.  Maintain regular health, dental, and eye exams.  Eat a healthy diet. Foods like vegetables, fruits, whole grains, low-fat dairy products, and lean protein foods contain the nutrients you need without too many calories. Decrease your intake of foods high in solid fats, added sugars, and salt. Get information about a proper diet from your caregiver, if necessary.  Regular physical exercise is one of the most important things you can do for your health. Most adults should get at least 150 minutes of moderate-intensity exercise (any activity that increases your heart rate and causes you to sweat) each week. In addition, most adults need muscle-strengthening exercises on 2 or more days a week.   Maintain a healthy weight. The body mass index (BMI) is a screening tool to identify possible weight problems. It provides an estimate of body fat based on height and weight. Your caregiver can help determine your BMI, and can  help you achieve or maintain a healthy weight. For adults 20 years and older:  A BMI below 18.5 is considered underweight.  A BMI of 18.5 to 24.9 is normal.  A BMI of 25 to 29.9 is considered overweight.  A BMI of 30 and above is considered obese.  Maintain normal blood lipids and cholesterol by exercising and minimizing your intake of saturated fat. Eat a balanced diet with plenty of fruits and vegetables. Blood tests for lipids and cholesterol should begin at age 2 and be repeated every 5 years. If your lipid or  cholesterol levels are high, you are over 50, or you are a high risk for heart disease, you may need your cholesterol levels checked more frequently.Ongoing high lipid and cholesterol levels should be treated with medicines if diet and exercise are not effective.  If you smoke, find out from your caregiver how to quit. If you do not use tobacco, do not start.  Lung cancer screening is recommended for adults aged 17 80 years who are at high risk for developing lung cancer because of a history of smoking. Yearly low-dose computed tomography (CT) is recommended for people who have at least a 30-pack-year history of smoking and are a current smoker or have quit within the past 15 years. A pack year of smoking is smoking an average of 1 pack of cigarettes a day for 1 year (for example: 1 pack a day for 30 years or 2 packs a day for 15 years). Yearly screening should continue until the smoker has stopped smoking for at least 15 years. Yearly screening should also be stopped for people who develop a health problem that would prevent them from having lung cancer treatment.  If you are pregnant, do not drink alcohol. If you are breastfeeding, be very cautious about drinking alcohol. If you are not pregnant and choose to drink alcohol, do not exceed 1 drink per day. One drink is considered to be 12 ounces (355 mL) of beer, 5 ounces (148 mL) of wine, or 1.5 ounces (44 mL) of liquor.  Avoid use of street drugs. Do not share needles with anyone. Ask for help if you need support or instructions about stopping the use of drugs.  High blood pressure causes heart disease and increases the risk of stroke. Blood pressure should be checked at least every 1 to 2 years. Ongoing high blood pressure should be treated with medicines, if weight loss and exercise are not effective.  If you are 37 to 63 years old, ask your caregiver if you should take aspirin to prevent strokes.  Diabetes screening involves taking a blood sample  to check your fasting blood sugar level. This should be done once every 3 years, after age 67, if you are within normal weight and without risk factors for diabetes. Testing should be considered at a younger age or be carried out more frequently if you are overweight and have at least 1 risk factor for diabetes.  Breast cancer screening is essential preventative care for women. You should practice "breast self-awareness." This means understanding the normal appearance and feel of your breasts and may include breast self-examination. Any changes detected, no matter how small, should be reported to a caregiver. Women in their 48s and 30s should have a clinical breast exam (CBE) by a caregiver as part of a regular health exam every 1 to 3 years. After age 15, women should have a CBE every year. Starting at age 78, women should consider having  a mammogram (breast X-ray) every year. Women who have a family history of breast cancer should talk to their caregiver about genetic screening. Women at a high risk of breast cancer should talk to their caregiver about having an MRI and a mammogram every year.  Breast cancer gene (BRCA)-related cancer risk assessment is recommended for women who have family members with BRCA-related cancers. BRCA-related cancers include breast, ovarian, tubal, and peritoneal cancers. Having family members with these cancers may be associated with an increased risk for harmful changes (mutations) in the breast cancer genes BRCA1 and BRCA2. Results of the assessment will determine the need for genetic counseling and BRCA1 and BRCA2 testing.  The Pap test is a screening test for cervical cancer. Women should have a Pap test starting at age 53. Between ages 36 and 2, Pap tests should be repeated every 2 years. Beginning at age 75, you should have a Pap test every 3 years as long as the past 3 Pap tests have been normal. If you had a hysterectomy for a problem that was not cancer or a condition  that could lead to cancer, then you no longer need Pap tests. If you are between ages 110 and 52, and you have had normal Pap tests going back 10 years, you no longer need Pap tests. If you have had past treatment for cervical cancer or a condition that could lead to cancer, you need Pap tests and screening for cancer for at least 20 years after your treatment. If Pap tests have been discontinued, risk factors (such as a new sexual partner) need to be reassessed to determine if screening should be resumed. Some women have medical problems that increase the chance of getting cervical cancer. In these cases, your caregiver may recommend more frequent screening and Pap tests.  The human papillomavirus (HPV) test is an additional test that may be used for cervical cancer screening. The HPV test looks for the virus that can cause the cell changes on the cervix. The cells collected during the Pap test can be tested for HPV. The HPV test could be used to screen women aged 58 years and older, and should be used in women of any age who have unclear Pap test results. After the age of 64, women should have HPV testing at the same frequency as a Pap test.  Colorectal cancer can be detected and often prevented. Most routine colorectal cancer screening begins at the age of 51 and continues through age 72. However, your caregiver may recommend screening at an earlier age if you have risk factors for colon cancer. On a yearly basis, your caregiver may provide home test kits to check for hidden blood in the stool. Use of a small camera at the end of a tube, to directly examine the colon (sigmoidoscopy or colonoscopy), can detect the earliest forms of colorectal cancer. Talk to your caregiver about this at age 39, when routine screening begins. Direct examination of the colon should be repeated every 5 to 10 years through age 37, unless early forms of pre-cancerous polyps or small growths are found.  Hepatitis C blood testing is  recommended for all people born from [birth year] through 1965 and any individual with known risks for hepatitis C.  Practice safe sex. Use condoms and avoid high-risk sexual practices to reduce the spread of sexually transmitted infections (STIs). Sexually active women aged 81 and younger should be checked for Chlamydia, which is a common sexually transmitted infection. Older women with new or  multiple partners should also be tested for Chlamydia. Testing for other STIs is recommended if you are sexually active and at increased risk.  Osteoporosis is a disease in which the bones lose minerals and strength with aging. This can result in serious bone fractures. The risk of osteoporosis can be identified using a bone density scan. Women ages 84 and over and women at risk for fractures or osteoporosis should discuss screening with their caregivers. Ask your caregiver whether you should be taking a calcium supplement or vitamin D to reduce the rate of osteoporosis.  Menopause can be associated with physical symptoms and risks. Hormone replacement therapy is available to decrease symptoms and risks. You should talk to your caregiver about whether hormone replacement therapy is right for you.  Use sunscreen. Apply sunscreen liberally and repeatedly throughout the day. You should seek shade when your shadow is shorter than you. Protect yourself by wearing long sleeves, pants, a wide-brimmed hat, and sunglasses year round, whenever you are outdoors.  Notify your caregiver of new moles or changes in moles, especially if there is a change in shape or color. Also notify your caregiver if a mole is larger than the size of a pencil eraser.  Stay current with your immunizations. Document Released: 11/17/2010 Document Revised: 08/29/2012 Document Reviewed: 11/17/2010 [facility] Patient Information 2014 [place].

## 2013-11-21 NOTE — Progress Notes (Signed)
Crystal York 04/08/1951 [id number]        63 y.o.  G2P2 for followup exam. Several issues or below.  Past medical history,surgical history, problem list, medications, allergies, family history and social history were all reviewed and documented as reviewed in the EPIC chart.  ROS:  12 system ROS performed with pertinent positives and negatives included in the history, assessment and plan.   Additional significant findings :  None   Exam: Kim Counsellor Vitals:   11/21/13 1130  BP: 126/78  Height: 5\' 4"  (1.626 m)  Weight: 354 lb (160.573 kg)   General appearance:  Normal affect, orientation and appearance. Skin: Grossly normal HEENT: Without gross lesions.  No cervical or supraclavicular adenopathy. Thyroid normal.  Lungs:  Clear without wheezing, rales or rhonchi Cardiac: RR, without RMG Abdominal:  Soft, nontender, without masses, guarding, rebound, organomegaly or hernia Breasts:  Examined lying and sitting without masses, retractions, discharge or axillary adenopathy. Pelvic:  Ext/BUS/vagina with generalized irritative changes consistent with mild yeast.  Cervix grossly normal high in the vaginal vault  Uterus difficult to palpate but no gross masses or tenderness  Adnexa  Without gross masses or tenderness    Anus and perineum  Normal   Rectovaginal  Normal sphincter tone without palpated masses or tenderness.    Assessment/Plan:  63 y.o. G2P2 female for followup exam 1. History of yeast infections. She gets periodic infections in her groin and panniculus. Has used Mytrex in the past. Suggest a trial of nystatin powder to see if this doesn't help and I prescribed 30 g and with several refills. 2. Postmenopausal/atrophic genital changes. Without significant hot flushes, night sweats, vaginal dryness. Is not sexually active. No vaginal bleeding. Continue to monitor and report any vaginal bleeding. Had been evaluated 2013 for an episode of bleeding with negative  sonohysterogram and biopsy. 3. History of vitamin D deficiency with level of 11 previously. Check vitamin D level today. Patient is taking 1000 units daily. Will plan DEXA at age 36. 57. Mammography 2001. I again strongly recommended patient schedule screening mammogram. She does have a history of cancer x2 in colon and renal. Patient agrees to schedule. She understands the benefits of early detection. SBE monthly reviewed. 5. Colonoscopy 2015. Repeat at their recommended interval. 6. Pap smear 2012. Pap smear done today. No history of significant abnormal Pap smears previously. 7. Health maintenance. No routine blood work done as this is done through her primary physician's office. Followup one year, sooner as needed.   Note: This document was prepared with digital dictation and possible smart phrase technology. Any transcriptional errors that result from this process are unintentional.   Anastasio Auerbach MD, 12:29 PM 11/21/2013

## 2013-11-22 LAB — URINALYSIS W MICROSCOPIC + REFLEX CULTURE
Bilirubin Urine: NEGATIVE
CRYSTALS: NONE SEEN
Casts: NONE SEEN
Glucose, UA: NEGATIVE mg/dL
Hgb urine dipstick: NEGATIVE
KETONES UR: NEGATIVE mg/dL
NITRITE: NEGATIVE
PH: 7.5 (ref 5.0–8.0)
Protein, ur: NEGATIVE mg/dL
SPECIFIC GRAVITY, URINE: 1.019 (ref 1.005–1.030)
Urobilinogen, UA: 0.2 mg/dL (ref 0.0–1.0)

## 2013-11-22 LAB — VITAMIN D 25 HYDROXY (VIT D DEFICIENCY, FRACTURES): VIT D 25 HYDROXY: 52 ng/mL (ref 30–89)

## 2013-11-23 ENCOUNTER — Other Ambulatory Visit: Payer: Self-pay | Admitting: Gynecology

## 2013-11-23 LAB — CYTOLOGY - PAP

## 2013-11-23 MED ORDER — SULFAMETHOXAZOLE-TMP DS 800-160 MG PO TABS: 1.0000 | ORAL_TABLET | Freq: Two times a day (BID) | ORAL | Status: DC

## 2013-11-24 LAB — URINE CULTURE: Colony Count: >=100000

## 2013-12-26 ENCOUNTER — Encounter (INDEPENDENT_AMBULATORY_CARE_PROVIDER_SITE_OTHER): Payer: Medicare HMO | Admitting: Surgery

## 2014-03-19 ENCOUNTER — Encounter: Payer: Self-pay | Admitting: Gynecology

## 2014-06-15 ENCOUNTER — Telehealth: Payer: Self-pay | Admitting: Oncology

## 2014-06-15 NOTE — Telephone Encounter (Signed)
S/w pt advised appt chg from 4/15 [facility] to 4/14 @ 11am. Pt verbalized understanding.

## 2014-08-24 ENCOUNTER — Telehealth: Payer: Self-pay | Admitting: Oncology

## 2014-08-24 NOTE — Telephone Encounter (Signed)
Lft msg for pt confirming labs/ov r/s per MD schedule, mailed out schedule to pt.. .KJ

## 2014-08-30 ENCOUNTER — Ambulatory Visit: Payer: Self-pay | Admitting: Oncology

## 2014-08-30 ENCOUNTER — Other Ambulatory Visit: Payer: Medicare HMO

## 2014-08-31 ENCOUNTER — Ambulatory Visit: Payer: Commercial Managed Care - HMO | Admitting: Oncology

## 2014-08-31 ENCOUNTER — Other Ambulatory Visit: Payer: Commercial Managed Care - HMO

## 2014-09-17 ENCOUNTER — Other Ambulatory Visit: Payer: Self-pay | Admitting: *Deleted

## 2014-09-17 DIAGNOSIS — C189 Malignant neoplasm of colon, unspecified: Secondary | ICD-10-CM

## 2014-09-18 ENCOUNTER — Other Ambulatory Visit (HOSPITAL_BASED_OUTPATIENT_CLINIC_OR_DEPARTMENT_OTHER): Payer: Medicare HMO

## 2014-09-18 ENCOUNTER — Telehealth: Payer: Self-pay | Admitting: Oncology

## 2014-09-18 ENCOUNTER — Ambulatory Visit (HOSPITAL_BASED_OUTPATIENT_CLINIC_OR_DEPARTMENT_OTHER): Payer: Medicare HMO | Admitting: Oncology

## 2014-09-18 VITALS — BP 143/80 | HR 73 | Temp 97.8°F | Resp 20 | Ht 64.0 in | Wt 360.3 lb

## 2014-09-18 DIAGNOSIS — Z85528 Personal history of other malignant neoplasm of kidney: Secondary | ICD-10-CM

## 2014-09-18 DIAGNOSIS — Z85038 Personal history of other malignant neoplasm of large intestine: Secondary | ICD-10-CM | POA: Diagnosis not present

## 2014-09-18 DIAGNOSIS — G8929 Other chronic pain: Secondary | ICD-10-CM

## 2014-09-18 DIAGNOSIS — M545 Low back pain: Secondary | ICD-10-CM | POA: Diagnosis not present

## 2014-09-18 DIAGNOSIS — C189 Malignant neoplasm of colon, unspecified: Secondary | ICD-10-CM

## 2014-09-18 NOTE — Progress Notes (Signed)
  [facility] OFFICE PROGRESS NOTE   Diagnosis: Colon cancer  INTERVAL HISTORY:   Crystal York returns as scheduled. No difficulty with bowel function. She had a negative colonoscopy by Dr. Benson Norway on 11/03/2013. She is scheduled to see Dr. Janice Norrie later this month. Her chief complaint is chronic low back pain. She is followed by orthopedics. Objective:  Vital signs in last 24 hours:  Blood pressure 143/80, pulse 73, temperature 97.8 F (36.6 C), temperature source Oral, resp. rate 20, height 5\' 4"  (1.626 m), weight 360 lb 4.8 oz (163.431 kg), last menstrual period 10/01/2010, SpO2 99 %.    HEENT: Neck without mass Lymphatics: No cervical, supraclavicular, axillary, or inguinal nodes Resp: Lungs clear bilaterally Cardio: Regular rate and rhythm GI: No hepatomegaly, nontender, no mass Vascular: Chronic stasis change at the lower leg bilaterally   Lab Results:   Lab Results  Component Value Date   CEA 0.6 09/01/2013    Medications: I have reviewed the patient's current medications.  Assessment/Plan: 1. 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 Restaging CT evaluation 07/31/2009 showed no evidence of recurrent colon cancer. Restaging CT evaluation 08/01/2010 showed no evidence of recurrent colon cancer.  2. Status post colonoscopy by Dr. Benson Norway 10/02/2008 with findings of a sessile polyp in the ascending colon and small hemorrhoids. The pathology from the polyp confirmed a tubular adenoma. Negative colonoscopy 11/03/2013  3. History of anemia. 4. Enlarged prevascular lymph node on CT 11/03/2006, stable on CT scans April 2009, March 2010 and March 2011. 5. History of renal insufficiency. 6. History of an elevated preoperative CEA. CEA was normal on 09/01/2013 7. 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 She continues to follow up with Dr. Janice Norrie. 8. "Cystic" lesion of  the subcutaneous tissue at the right lumbar level. A cyst has been present in this area dating back to CT scans from 2008. This is felt to likely be a benign finding. 9. Chronic back pain-followed by orthopedics.   Disposition:  Ms. Laning remains in clinical remission from colon cancer and renal cell carcinoma. She will see Dr. Janice Norrie later this month for follow-up of the renal cell carcinoma. She would like to continue follow-up at the [facility]. Ms. Bigler will return for an office visit and CEA in one year.  Betsy Coder, MD  09/18/2014  12:02 PM

## 2014-09-18 NOTE — Telephone Encounter (Signed)
Gave avs & calendar for May 2017 °

## 2014-09-19 ENCOUNTER — Telehealth: Payer: Self-pay | Admitting: *Deleted

## 2014-09-19 LAB — CEA

## 2014-09-19 NOTE — Telephone Encounter (Signed)
-----   Message from Ladell Pier, MD sent at 09/19/2014  3:16 PM EDT ----- Please call patient, cea is normal

## 2014-09-19 NOTE — Telephone Encounter (Signed)
Called pt with normal CEA results. She voiced appreciation for call.

## 2015-01-13 ENCOUNTER — Encounter (HOSPITAL_COMMUNITY): Payer: Self-pay | Admitting: Emergency Medicine

## 2015-01-13 ENCOUNTER — Emergency Department (HOSPITAL_COMMUNITY)
Admission: EM | Admit: 2015-01-13 | Discharge: 2015-01-14 | Disposition: A | Discharge status: Home / Self Care | Payer: Medicare HMO | Attending: Emergency Medicine | Admitting: Emergency Medicine

## 2015-01-13 DIAGNOSIS — Z87442 Personal history of urinary calculi: Secondary | ICD-10-CM | POA: Diagnosis not present

## 2015-01-13 DIAGNOSIS — Z9884 Bariatric surgery status: Secondary | ICD-10-CM | POA: Diagnosis not present

## 2015-01-13 DIAGNOSIS — G8929 Other chronic pain: Secondary | ICD-10-CM | POA: Diagnosis not present

## 2015-01-13 DIAGNOSIS — M545 Low back pain, unspecified: Secondary | ICD-10-CM

## 2015-01-13 DIAGNOSIS — Z87891 Personal history of nicotine dependence: Secondary | ICD-10-CM | POA: Insufficient documentation

## 2015-01-13 DIAGNOSIS — Z85528 Personal history of other malignant neoplasm of kidney: Secondary | ICD-10-CM | POA: Insufficient documentation

## 2015-01-13 DIAGNOSIS — Z85038 Personal history of other malignant neoplasm of large intestine: Secondary | ICD-10-CM | POA: Diagnosis not present

## 2015-01-13 DIAGNOSIS — Z9889 Other specified postprocedural states: Secondary | ICD-10-CM | POA: Insufficient documentation

## 2015-01-13 DIAGNOSIS — Z79899 Other long term (current) drug therapy: Secondary | ICD-10-CM | POA: Diagnosis not present

## 2015-01-13 MED ORDER — ONDANSETRON HCL 4 MG/2ML IJ SOLN
4.0000 mg | Freq: Once | INTRAMUSCULAR | Status: AC
  Administered 2015-01-13: 4 mg via INTRAVENOUS
  Filled 2015-01-13: qty 2

## 2015-01-13 MED ORDER — PROMETHAZINE HCL 25 MG/ML IJ SOLN
25.0000 mg | Freq: Once | INTRAMUSCULAR | Status: AC
  Administered 2015-01-13: 25 mg via INTRAVENOUS
  Filled 2015-01-13: qty 1

## 2015-01-13 MED ORDER — KETOROLAC TROMETHAMINE 30 MG/ML IJ SOLN
30.0000 mg | Freq: Once | INTRAMUSCULAR | Status: AC
  Administered 2015-01-13: 30 mg via INTRAVENOUS
  Filled 2015-01-13: qty 1

## 2015-01-13 MED ORDER — HYDROMORPHONE HCL 1 MG/ML IJ SOLN
1.0000 mg | Freq: Once | INTRAMUSCULAR | Status: AC
  Administered 2015-01-13: 1 mg via INTRAVENOUS
  Filled 2015-01-13: qty 1

## 2015-01-13 NOTE — ED Notes (Signed)
Bed: PX10 Expected date:  Expected time:  Means of arrival:  Comments: 21F Back pain/chronic 10/10 fentanyl given

## 2015-01-13 NOTE — ED Provider Notes (Signed)
CSN: [id number]     Arrival date & time 01/13/15  2058 History   First MD Initiated Contact with Patient 01/13/15 2114     Chief Complaint  Patient presents with  . Back Pain     (Consider location/radiation/quality/duration/timing/severity/associated sxs/prior Treatment) HPI  64 year old female who presents with back pain. She was on the commode today, wiping her butt, when she developed back pain. History of chronic back pain associated with degenerative changes. The act of moving off her commode made back pain worse. History of colon cancer s/p colectomy and CTX, now in remission. Also history of nephrolithiasis, but denies that this is similar to that.    She has otherwise been in her usual state of health. Denies fever, chills, nausea, vomiting, diarrhea, abdominal pain, URI symptoms, or urinary symptoms.  Denies urinary retention, urinary or fecal incontinence. Denies numbness or weakness  Past Medical History  Diagnosis Date  . H/O seasonal allergies   . History of kidney stones   . Colon cancer     '08- chemotherapy after surgery  . Colon cancer   . Renal cancer     '08 left neprectomy  . Chronic back pain     chronic pain "deteriorating"   Past Surgical History  Procedure Laterality Date  . Hand surgery      carpel tunnel surgery both hands  . Gastric bypass  1982  . Kidney surgery    . Colon surgery    . Tonsillectomy  09/1969  . Hemorrhoid surgery  09/1969  . Tubal ligation    . Nephrectomy    . Bariatric surgery    . Carpal tunnel release    . Hemorrhoid surgery    . Radioactive therapy on back  2013  . Colposcopy    . Hernia repair  09/2006  . Colonoscopy with propofol N/A 11/03/2013    Procedure: COLONOSCOPY WITH PROPOFOL;  Surgeon: Beryle Beams, MD;  Location: [facility];  Service: Endoscopy;  Laterality: N/A;  . Hysteroscopy  10/2009    Endometrial polyp   Family History  Problem Relation Age of Onset  . Diabetes Mother   . Hypertension Mother   .  Colon cancer Sister   . Cancer Sister     pancreatic and liver  . Lung cancer Sister   . Cancer Other     lung and colon   Social History  Substance Use Topics  . Smoking status: Former Smoker    Quit date: 04/27/1995  . Smokeless tobacco: None  . Alcohol Use: No   OB History    Gravida Para Term Preterm AB TAB SAB Ectopic Multiple Living   2 2        2      Review of Systems 10/14 systems reviewed and are negative other than those stated in the HPI    Allergies  Ciprofloxacin; Codeine; and Macrodantin  Home Medications   Prior to Admission medications   Medication Sig Start Date End Date Taking? Authorizing Provider  glucosamine-chondroitin 500-400 MG tablet Take 2 tablets by mouth daily.    Yes Historical Provider, MD  methocarbamol (ROBAXIN) 750 MG tablet Take 750 mg by mouth 2 (two) times daily. 09/07/14  Yes Historical Provider, MD  cholecalciferol (VITAMIN D) 1000 UNITS tablet Take 2,000 Units by mouth daily.     Historical Provider, MD  HYDROcodone-acetaminophen (NORCO/VICODIN) 5-325 MG per tablet Take 1 tablet by mouth every 4 (four) hours as needed for severe pain. 01/14/15   Hinton Dyer  Roderic Ovens, MD  naproxen sodium (ANAPROX) 220 MG tablet Take 220 mg by mouth 2 (two) times daily as needed.    Historical Provider, MD  tetrahydrozoline 0.05 % ophthalmic solution Place 1 drop into both eyes as needed (Red eyes).    Historical Provider, MD  Turmeric 500 MG CAPS Take 500 mg by mouth daily.    Historical Provider, MD  vitamin B-12 (CYANOCOBALAMIN) 1000 MCG tablet Take 1,000 mcg by mouth daily.    Historical Provider, MD   BP 138/65 mmHg  Pulse 65  Temp(Src) 98.1 F (36.7 C) (Oral)  Resp 22  SpO2 92%  LMP 10/01/2010 Physical Exam Physical Exam  Nursing note and vitals reviewed. Constitutional: Well developed, well nourished, non-toxic, and in no acute distress Head: Normocephalic and atraumatic.  Mouth/Throat: Oropharynx is clear and moist.  Neck: Normal range of motion.  Neck supple.  Cardiovascular: Normal rate and regular rhythm.   Pulmonary/Chest: Effort normal and breath sounds normal.  Abdominal: Soft. Morbidly obese. There is no tenderness. There is no rebound and no guarding. No CVA tenderness. Musculoskeletal: Normal range of motion. No deformities. Tenderness across low lumbar spine and paraspinally Skin: Skin is warm and dry.  Psychiatric: Cooperative Neurological:  Alert, oriented to person, place, time, and situation. Memory grossly in tact. Fluent speech. No dysarthria or aphasia.  Reflexes +2 achilles and patellar bilaterally.  Muscle bulk and tone normal. Full strength hip flexor/extensors/abductors/adductions, knee extensor/flexors, and ankles dorsi/plantarflexion Sensation to light touch is in tact throughout in bilateral upper and lower extremities.   ED Course  Procedures (including critical care time)   MDM   Final diagnoses:  Midline low back pain without sciatica    64 year old female with chronic low back pain who presents with back sprain and acute on chronic back pain while getting off of commode. She is non-toxic and not in distress, but reports feeling very nauseous from receiving 200 mcg fentanyl by EMS. She is given zofran. Pain reportedly still 10/10 in severity and given 1 mg dilaudid, 30 mg toradol, and additional phenergan. She reports 7/10 pain subsequently, which baseline 6/10 at daily bases. She is neurovascularly in tact and without concerning bowel/urinary symptoms. Pain similar in character to usual chronic pain. Low suspicion for serious or emergent etiology of her back pain at this time.  Narcotic medications have made her very nauseated and lightheaded. She will be observed until she is able to tolerate PO and ambulate in ED. Will be discharged with outpatient follow-up.     Forde Dandy, MD 01/14/15 [phone]

## 2015-01-13 NOTE — ED Notes (Signed)
MD at bedside. 

## 2015-01-13 NOTE — ED Notes (Signed)
Pt come from home w/  Hx of chronic back pain. States that tonight she turned the wrong way on the toilet and now has pain in her back and down her legs. 20g IV in L posterior hand. 249mcg fentanyl given IV. Pain now 7/10. Nauseated from meds. Alert and oriented.

## 2015-01-14 MED ORDER — HYDROCODONE-ACETAMINOPHEN 5-325 MG PO TABS
1.0000 | ORAL_TABLET | Freq: Once | ORAL | Status: AC
  Administered 2015-01-14: 2 via ORAL
  Filled 2015-01-14: qty 2

## 2015-01-14 MED ORDER — HYDROCODONE-ACETAMINOPHEN 5-325 MG PO TABS: 1.0000 | ORAL_TABLET | ORAL | Status: DC | PRN

## 2015-01-14 NOTE — ED Notes (Signed)
Pt ambulatory to bathroom

## 2015-01-14 NOTE — Discharge Instructions (Signed)
Please see your primary care doctor for follow-up regarding further management. Return for worsening symptoms, including difficulty walking, weakness, numbness, loss of bowel or bladder, or any other symptoms concerning to you.  Back Pain, Adult Back pain is very common. The pain often gets better over time. The cause of back pain is usually not dangerous. Most people can learn to manage their back pain on their own.  HOME CARE   Stay active. Start with short walks on flat ground if you can. Try to walk farther each day.  Do not sit, drive, or stand in one place for more than 30 minutes. Do not stay in bed.  Do not avoid exercise or work. Activity can help your back heal faster.  Be careful when you bend or lift an object. Bend at your knees, keep the object close to you, and do not twist.  Sleep on a firm mattress. Lie on your side, and bend your knees. If you lie on your back, put a pillow under your knees.  Only take medicines as told by your doctor.  Put ice on the injured area.  Put ice in a plastic bag.  Place a towel between your skin and the bag.  Leave the ice on for 15-20 minutes, 03-04 times a day for the first 2 to 3 days. After that, you can switch between ice and heat packs.  Ask your doctor about back exercises or massage.  Avoid feeling anxious or stressed. Find good ways to deal with stress, such as exercise. GET HELP RIGHT AWAY IF:   Your pain does not go away with rest or medicine.  Your pain does not go away in 1 week.  You have new problems.  You do not feel well.  The pain spreads into your legs.  You cannot control when you poop (bowel movement) or pee (urinate).  Your arms or legs feel weak or lose feeling (numbness).  You feel sick to your stomach (nauseous) or throw up (vomit).  You have belly (abdominal) pain.  You feel like you may pass out (faint). MAKE SURE YOU:   Understand these instructions.  Will watch your condition.  Will get  help right away if you are not doing well or get worse. Document Released: 10/21/2007 Document Revised: 07/27/2011 Document Reviewed: 09/05/2013 [facility] Patient Information 2015 [place], [place]. This information is not intended to replace advice given to you by your health care provider. Make sure you discuss any questions you have with your health care provider.

## 2015-01-14 NOTE — ED Notes (Signed)
Pt provided with crackers and water for po challenge.

## 2015-01-14 NOTE — ED Notes (Signed)
Patient was alert, oriented and stable upon discharge. RN went over AVS and patient had no further questions.  

## 2015-03-08 ENCOUNTER — Other Ambulatory Visit (HOSPITAL_COMMUNITY): Payer: Self-pay | Admitting: Physical Medicine and Rehabilitation

## 2015-03-08 DIAGNOSIS — M545 Low back pain: Secondary | ICD-10-CM

## 2015-03-26 ENCOUNTER — Encounter (HOSPITAL_COMMUNITY): Payer: Self-pay

## 2015-03-26 ENCOUNTER — Encounter (HOSPITAL_COMMUNITY)
Admission: RE | Admit: 2015-03-26 | Discharge: 2015-03-26 | Disposition: A | Discharge status: Home / Self Care | Payer: Medicare HMO | Source: Ambulatory Visit | Attending: Oncology | Admitting: Oncology

## 2015-03-26 DIAGNOSIS — M47816 Spondylosis without myelopathy or radiculopathy, lumbar region: Secondary | ICD-10-CM | POA: Diagnosis not present

## 2015-03-26 DIAGNOSIS — Z6841 Body Mass Index (BMI) 40.0 and over, adult: Secondary | ICD-10-CM | POA: Diagnosis not present

## 2015-03-26 DIAGNOSIS — M545 Low back pain: Secondary | ICD-10-CM | POA: Diagnosis not present

## 2015-03-26 DIAGNOSIS — Z85528 Personal history of other malignant neoplasm of kidney: Secondary | ICD-10-CM | POA: Diagnosis not present

## 2015-03-26 DIAGNOSIS — Z85038 Personal history of other malignant neoplasm of large intestine: Secondary | ICD-10-CM | POA: Diagnosis not present

## 2015-03-26 DIAGNOSIS — M4806 Spinal stenosis, lumbar region: Secondary | ICD-10-CM | POA: Diagnosis not present

## 2015-03-26 DIAGNOSIS — G8929 Other chronic pain: Secondary | ICD-10-CM | POA: Diagnosis not present

## 2015-03-26 DIAGNOSIS — Z9049 Acquired absence of other specified parts of digestive tract: Secondary | ICD-10-CM | POA: Diagnosis not present

## 2015-03-26 DIAGNOSIS — Z9884 Bariatric surgery status: Secondary | ICD-10-CM | POA: Diagnosis not present

## 2015-03-26 DIAGNOSIS — Z87891 Personal history of nicotine dependence: Secondary | ICD-10-CM | POA: Diagnosis not present

## 2015-03-26 DIAGNOSIS — Z905 Acquired absence of kidney: Secondary | ICD-10-CM | POA: Diagnosis not present

## 2015-03-26 HISTORY — DX: Anemia, unspecified: D64.9

## 2015-03-26 LAB — CBC
HEMATOCRIT: 37.6 % (ref 36.0–46.0)
Hemoglobin: 12.5 g/dL (ref 12.0–15.0)
MCH: 28.6 pg (ref 26.0–34.0)
MCHC: 33.2 g/dL (ref 30.0–36.0)
MCV: 86 fL (ref 78.0–100.0)
Platelets: 167 10*3/uL (ref 150–400)
RBC: 4.37 MIL/uL (ref 3.87–5.11)
RDW: 14 % (ref 11.5–15.5)
WBC: 8.9 10*3/uL (ref 4.0–10.5)

## 2015-03-26 LAB — BASIC METABOLIC PANEL
Anion gap: 8 (ref 5–15)
BUN: 9 mg/dL (ref 6–20)
CALCIUM: 9.3 mg/dL (ref 8.9–10.3)
CO2: 26 mmol/L (ref 22–32)
CREATININE: 1.22 mg/dL — AB (ref 0.44–1.00)
Chloride: 102 mmol/L (ref 101–111)
GFR calc Af Amer: 53 mL/min — ABNORMAL LOW (ref >60)
GFR calc non Af Amer: 46 mL/min — ABNORMAL LOW (ref >60)
GLUCOSE: 96 mg/dL (ref 65–99)
Potassium: 4.5 mmol/L (ref 3.5–5.1)
Sodium: 136 mmol/L (ref 135–145)

## 2015-03-26 NOTE — Progress Notes (Addendum)
Patient denies chest pain, shortness of breath, cardiac test. PCP Dr. Redmond Pulling. Prior to seeing spoke to Dr. Leland Her office regarding H&P and they state that they do not do them and that the PCP is responsible for doing it. During PAT visit she stated form was sent by Dr. Mina Marble to PCP. Call Dr. Redmond Pulling they advised the form they have is blank. Spoke to Dr. Mina Marble and they do not have the form filled out.

## 2015-03-26 NOTE — Progress Notes (Addendum)
Anesthesia PAT Evaluation: Patient is a 64 year old female scheduled for MRI L-spine under anesthesia on 03/28/15. MRI was ordered by Dr. Normajean Glasgow ([place]). She initially tried to have the MRI without any sedation then later with Valium, but could not tolerate either. Dr. Silas Flood has forwarded her H&P form to be completed by her PCP Dr. Redmond Pulling.   History includes former smoker, nephrolithiasis, colon cancer s/p partial colectomy 10/04/06 (Dr. Georgette Dover) and chemotherapy, renal cancer s/p left nephrectomy (Dr. Janice Norrie) 10/04/06, chronic back pain, anemia, tonsillectomy, gastric bypass '82. BMI is 56, consistent with super morbid obesity.   Meds include Valium (takes PRN vertigo), tramadol, Robaxin.  Vitals: BP 120/68, HR 73, RR 20, O2 sat 100%, T 36.7C.  RN asked me to evaluate due to concerns of difficult airway. Patient is morbidly obese with large neck. In 1982, it sounds like her anesthesiologist may have done an awake look, but otherwise she denied being told that she was a difficult intubation. She did have GA for her colon resection/nephrecotmy in 2008. Miller #3 used, grade I view, 7.0 ETT placed X 1 attempt. She says weight has not changed significantly since then. Anesthesiologist evaluation on 11/03/13 prior to her colonoscopy with Mallampati III, TM > 3 FB. Full neck ROM documented. Still with good mouth opening and neck ROM.   Heart RRR, no murmur noted. No carotid bruits noted. Lungs clear.  Reported previous cardiac testing > 20 years ago that included an echocardiogram to rule out "CHF." Reportedly results were WNL. She denied CP and SOB. She denied history of home O2, sleep study, or known OSA. Her OSA screen was 4. She reports there is a "cyst" on her right buttock that she is using an [employer] pad to cover. She is planning to remove the patch prior to her MRI.   BMET and CBC noted.  Definitive anesthesia plan following anesthesiologist evaluation on the day of surgery.   Chart will  be left for F/U Re: H&P.  [facility] Short Stay Center/Anesthesiology Phone [phone] 03/26/2015 2:43 PM  Addendum: Damaris Schooner with office staff at Dr. Dois Davenport office. He will complete H&P and fax ([phone]) before he leaves today.   [facility] Short Stay Center/Anesthesiology Phone [phone] 03/27/2015 4:31 PM

## 2015-03-26 NOTE — Pre-Procedure Instructions (Signed)
    Crystal York  03/26/2015      Your procedure is scheduled on November 10.  Report to [facility] Admitting at 6 A.M.  Call this number if you have problems the morning of surgery:  [phone]   Remember:  Do not eat food or drink liquids after midnight.  Take these medicines the morning of surgery with A SIP OF WATER Diazepam (if needed), Tramadol (if needed)   STOP/ Do not take Vitamins, Herbs, or Supplements starting today   Do not wear jewelry, make-up or nail polish.  Do not wear lotions, powders, or perfumes.  You may wear deodorant.  Do not shave 48 hours prior to surgery.  Men may shave face and neck.  Do not bring valuables to the hospital.  [facility] is not responsible for any belongings or valuables.  Contacts, dentures or bridgework may not be worn into surgery.  Leave your suitcase in the car.  After surgery it may be brought to your room.  For patients admitted to the hospital, discharge time will be determined by your treatment team.  Patients discharged the day of surgery will not be allowed to drive home.   Name and phone number of your driver:  Family/ Friend   Please read over the following fact sheets that you were given. Pain Booklet and Coughing and Deep Breathing

## 2015-03-26 NOTE — Progress Notes (Signed)
Spoke to [employer], Glass blower/designer at Dr. Dois Davenport office regarding H&P form she advised that they have not filled it out yet but that she will hand it to Dr. Redmond Pulling in the morning (he is out of office today). I gave her our charge nurse number to call and fax number to call. I advised her without this form MRI would be cancelled.

## 2015-03-27 NOTE — Progress Notes (Signed)
Called Dr Dois Davenport office and requested that H&P be faxed over. Pam (Dr Dois Davenport Nurse) states she has everything ready for him to sign and will fax over the H&P.

## 2015-03-28 ENCOUNTER — Ambulatory Visit (HOSPITAL_COMMUNITY)
Admission: RE | Admit: 2015-03-28 | Discharge: 2015-03-28 | Disposition: A | Discharge status: Home / Self Care | Payer: Medicare HMO | Source: Ambulatory Visit | Attending: Oncology | Admitting: Oncology

## 2015-03-28 ENCOUNTER — Encounter (HOSPITAL_COMMUNITY)
Admission: RE | Disposition: A | Discharge status: Home / Self Care | Payer: Self-pay | Source: Ambulatory Visit | Attending: Oncology

## 2015-03-28 ENCOUNTER — Ambulatory Visit (HOSPITAL_COMMUNITY): Payer: Medicare HMO | Admitting: Certified Registered Nurse Anesthetist

## 2015-03-28 ENCOUNTER — Ambulatory Visit (HOSPITAL_COMMUNITY)
Admission: RE | Admit: 2015-03-28 | Discharge: 2015-03-28 | Disposition: A | Discharge status: Home / Self Care | Payer: Medicare HMO | Source: Ambulatory Visit | Attending: Physical Medicine and Rehabilitation | Admitting: Physical Medicine and Rehabilitation

## 2015-03-28 ENCOUNTER — Ambulatory Visit (HOSPITAL_COMMUNITY): Payer: Medicare HMO | Admitting: Vascular Surgery

## 2015-03-28 ENCOUNTER — Encounter (HOSPITAL_COMMUNITY): Payer: Self-pay | Admitting: *Deleted

## 2015-03-28 DIAGNOSIS — M545 Low back pain: Secondary | ICD-10-CM

## 2015-03-28 DIAGNOSIS — Z85528 Personal history of other malignant neoplasm of kidney: Secondary | ICD-10-CM | POA: Insufficient documentation

## 2015-03-28 DIAGNOSIS — Z6841 Body Mass Index (BMI) 40.0 and over, adult: Secondary | ICD-10-CM | POA: Insufficient documentation

## 2015-03-28 DIAGNOSIS — M47816 Spondylosis without myelopathy or radiculopathy, lumbar region: Secondary | ICD-10-CM | POA: Insufficient documentation

## 2015-03-28 DIAGNOSIS — Z9884 Bariatric surgery status: Secondary | ICD-10-CM | POA: Insufficient documentation

## 2015-03-28 DIAGNOSIS — M4806 Spinal stenosis, lumbar region: Secondary | ICD-10-CM | POA: Insufficient documentation

## 2015-03-28 DIAGNOSIS — Z87891 Personal history of nicotine dependence: Secondary | ICD-10-CM | POA: Insufficient documentation

## 2015-03-28 DIAGNOSIS — G8929 Other chronic pain: Secondary | ICD-10-CM | POA: Diagnosis not present

## 2015-03-28 DIAGNOSIS — Z85038 Personal history of other malignant neoplasm of large intestine: Secondary | ICD-10-CM | POA: Insufficient documentation

## 2015-03-28 DIAGNOSIS — Z9049 Acquired absence of other specified parts of digestive tract: Secondary | ICD-10-CM | POA: Insufficient documentation

## 2015-03-28 DIAGNOSIS — Z905 Acquired absence of kidney: Secondary | ICD-10-CM | POA: Insufficient documentation

## 2015-03-28 HISTORY — PX: RADIOLOGY WITH ANESTHESIA: SHX6223

## 2015-03-28 SURGERY — RADIOLOGY WITH ANESTHESIA
Anesthesia: General

## 2015-03-28 MED ORDER — FENTANYL CITRATE (PF) 100 MCG/2ML IJ SOLN: 25.0000 ug | INTRAMUSCULAR | Status: DC | PRN

## 2015-03-28 MED ORDER — ONDANSETRON HCL 4 MG/2ML IJ SOLN
INTRAMUSCULAR | Status: AC
  Filled 2015-03-28: qty 2

## 2015-03-28 MED ORDER — ONDANSETRON HCL 4 MG/2ML IJ SOLN
4.0000 mg | Freq: Once | INTRAMUSCULAR | Status: AC | PRN
  Administered 2015-03-28: 4 mg via INTRAVENOUS

## 2015-03-28 NOTE — Anesthesia Preprocedure Evaluation (Addendum)
Anesthesia Evaluation  Patient identified by MRN, date of birth, ID band Patient awake    Reviewed: Allergy & Precautions, H&P , NPO status , Patient's Chart, lab work & pertinent test results  History of Anesthesia Complications Negative for: history of anesthetic complications  Airway Mallampati: I  TM Distance: >3 FB Neck ROM: full    Dental no notable dental hx.    Pulmonary neg pulmonary ROS, former smoker,    Pulmonary exam normal breath sounds clear to auscultation       Cardiovascular negative cardio ROS Normal cardiovascular exam Rhythm:regular Rate:Normal     Neuro/Psych  Neuromuscular disease (takes robaxin, lower back pain from obesity)    GI/Hepatic negative GI ROS, Neg liver ROS,   Endo/Other  Morbid obesity  Renal/GU CRFRenal disease2008 left nephrectomy and s/p chemotherapy     Musculoskeletal   Abdominal (+) + obese,   Peds  Hematology negative hematology ROS (+)   Anesthesia Other Findings In 2008 was Grade I view with Miller 3, 7.0 ETT  Reproductive/Obstetrics negative OB ROS                           Anesthesia Physical Anesthesia Plan  ASA: III  Anesthesia Plan: General   Post-op Pain Management:    Induction: Intravenous  Airway Management Planned: Oral ETT  Additional Equipment: None  Intra-op Plan:   Post-operative Plan:   Informed Consent: I have reviewed the patients History and Physical, chart, labs and discussed the procedure including the risks, benefits and alternatives for the proposed anesthesia with the patient or authorized representative who has indicated his/her understanding and acceptance.   Dental Advisory Given  Plan Discussed with: Anesthesiologist, CRNA and Surgeon  Anesthesia Plan Comments: (ETT given MRI positioning requirements and patient super morbid obesity)        Anesthesia Quick Evaluation

## 2015-03-28 NOTE — Progress Notes (Signed)
No H&P found in Care Everywhere, no fax received, office closed, will attempt to call once office opens, Dr. Veatrice Kells aware.

## 2015-03-28 NOTE — Anesthesia Postprocedure Evaluation (Signed)
  Anesthesia Post-op Note  Patient: Crystal York  Procedure(s) Performed: Procedure(s) (LRB): MRI LUMBER SPINE WITHOUT (N/A)  Patient Location: PACU  Anesthesia Type: General  Level of Consciousness: awake and alert   Airway and Oxygen Therapy: Patient Spontanous Breathing  Post-op Pain: mild  Post-op Assessment: Post-op Vital signs reviewed, Patient's Cardiovascular Status Stable, Respiratory Function Stable, Patent Airway and No signs of Nausea or vomiting  Last Vitals:  Filed Vitals:   03/28/15 1000  BP:   Pulse: 82  Temp:   Resp: 17    Post-op Vital Signs: stable   Complications: No apparent anesthesia complications

## 2015-03-28 NOTE — Anesthesia Procedure Notes (Signed)
Procedure Name: Intubation Date/Time: 03/28/2015 9:45 AM Performed by: Merdis Delay Pre-anesthesia Checklist: Patient identified, Emergency Drugs available, Patient being monitored, Suction available and Timeout performed Patient Re-evaluated:Patient Re-evaluated prior to inductionOxygen Delivery Method: Circle system utilized Preoxygenation: Pre-oxygenation with 100% oxygen Intubation Type: IV induction Laryngoscope Size: Mac and 4 Grade View: Grade III Tube type: Oral Tube size: 7.5 mm Number of attempts: 1 Airway Equipment and Method: Stylet Placement Confirmation: ETT inserted through vocal cords under direct vision,  positive ETCO2,  CO2 detector and breath sounds checked- equal and bilateral Secured at: 22 cm Tube secured with: Tape Difficulty Due To: Difficulty was anticipated

## 2015-03-28 NOTE — Transfer of Care (Signed)
Immediate Anesthesia Transfer of Care Note  Patient: Crystal York  Procedure(s) Performed: Procedure(s): MRI LUMBER SPINE WITHOUT (N/A)  Patient Location: PACU  Anesthesia Type:General  Level of Consciousness: awake, alert  and oriented  Airway & Oxygen Therapy: Patient Spontanous Breathing and Patient connected to face mask oxygen  Post-op Assessment: Report given to RN and Post -op Vital signs reviewed and stable  Post vital signs: Reviewed and stable  Last Vitals:  Filed Vitals:   03/28/15 0938  BP: 131/84  Pulse: 93  Temp: 36.9 C  Resp: 11    Complications: No apparent anesthesia complications

## 2015-03-28 NOTE — Addendum Note (Signed)
Addendum  created 03/28/15 1043 by Merdis Delay, CRNA   Modules edited: Charges VN

## 2015-04-01 ENCOUNTER — Encounter (HOSPITAL_COMMUNITY): Payer: Self-pay | Admitting: Radiology

## 2015-04-02 MED FILL — Phenylephrine HCl Inj 10 MG/ML: INTRAMUSCULAR | Qty: 1 | Status: AC

## 2015-04-02 MED FILL — Midazolam HCl Inj 2 MG/2ML (Base Equivalent): INTRAMUSCULAR | Qty: 2 | Status: AC

## 2015-04-02 MED FILL — Lidocaine HCl IV Inj 20 MG/ML: INTRAVENOUS | Qty: 5 | Status: AC

## 2015-04-02 MED FILL — Succinylcholine Chloride Inj 20 MG/ML: INTRAMUSCULAR | Qty: 10 | Status: AC

## 2015-04-02 MED FILL — Phenylephrine-NaCl Pref Syr 0.4 MG/10ML-0.9% (40 MCG/ML): INTRAVENOUS | Qty: 10 | Status: AC

## 2015-04-02 MED FILL — Glycopyrrolate Inj 0.2 MG/ML: INTRAMUSCULAR | Qty: 1 | Status: AC

## 2015-04-02 MED FILL — Fentanyl Citrate Preservative Free (PF) Inj 100 MCG/2ML: INTRAMUSCULAR | Qty: 2 | Status: AC

## 2015-04-02 MED FILL — Propofol IV Emul 200 MG/20ML (10 MG/ML): INTRAVENOUS | Qty: 20 | Status: AC

## 2015-04-02 MED FILL — Ondansetron HCl Inj 4 MG/2ML (2 MG/ML): INTRAMUSCULAR | Qty: 2 | Status: AC

## 2015-04-02 MED FILL — Lactated Ringer's Solution: INTRAVENOUS | Qty: 1000 | Status: AC

## 2015-09-17 ENCOUNTER — Telehealth: Payer: Self-pay | Admitting: Oncology

## 2015-09-17 ENCOUNTER — Ambulatory Visit (HOSPITAL_BASED_OUTPATIENT_CLINIC_OR_DEPARTMENT_OTHER): Payer: 59 | Admitting: Oncology

## 2015-09-17 ENCOUNTER — Other Ambulatory Visit (HOSPITAL_BASED_OUTPATIENT_CLINIC_OR_DEPARTMENT_OTHER): Payer: 59

## 2015-09-17 VITALS — BP 155/72 | HR 70 | Temp 97.8°F | Resp 16 | Ht 66.0 in | Wt 348.9 lb

## 2015-09-17 DIAGNOSIS — Z85038 Personal history of other malignant neoplasm of large intestine: Secondary | ICD-10-CM | POA: Diagnosis not present

## 2015-09-17 DIAGNOSIS — M545 Low back pain: Secondary | ICD-10-CM

## 2015-09-17 DIAGNOSIS — C189 Malignant neoplasm of colon, unspecified: Secondary | ICD-10-CM

## 2015-09-17 DIAGNOSIS — C184 Malignant neoplasm of transverse colon: Secondary | ICD-10-CM

## 2015-09-17 DIAGNOSIS — Z800 Family history of malignant neoplasm of digestive organs: Secondary | ICD-10-CM | POA: Diagnosis not present

## 2015-09-17 DIAGNOSIS — Z801 Family history of malignant neoplasm of trachea, bronchus and lung: Secondary | ICD-10-CM | POA: Diagnosis not present

## 2015-09-17 NOTE — Progress Notes (Signed)
  [place] OFFICE PROGRESS NOTE   Diagnosis:  Colon cancer, renal cell cancer  INTERVAL HISTORY:    Ms.  Vater returns as scheduled. She reports constipation with some of the medications she takes for back pain. Her chief complaint is low back pain. She is being evaluated at the pain clinic and reports improvement with a spine injection.    A [facility] 10/05/2014 to evaluate flank/back pain revealed an unremarkable unenhanced liver and normal left kidney. No lymphadenopathy.   she reports multiple siblings have been diagnosed with cancer including a sister with colon cancer/pancreas cancer, another sister with colon cancer, and a sister with lung cancer.  Objective:  Vital signs in last 24 hours:  Blood pressure 155/72, pulse 70, temperature 97.8 F (36.6 C), temperature source Oral, resp. rate 16, height 5\' 6"  (1.676 m), weight 348 lb 14.4 oz (158.26 kg), last menstrual period 10/01/2010, SpO2 99 %.    HEENT:  Neck without mass Lymphatics:  No cervical, supraclavicular, axillary, or inguinal nodes Resp:  Lungs clear bilaterally Cardio:  Regular rate and rhythm GI:  No hepatosplenomegaly, no mass, nontender Vascular:  Chronic stasis change at the lower leg bilaterally   Lab Results:   CEA pending   Medications: I have reviewed the patient's current medications.  Assessment/Plan: 1. Stage IIIC (T4 ,N2) colon cancer, status post partial colectomy 10/04/2006, followed by 12 cycles of adjuvant chemotherapy. Restaging CT evaluation 07/31/2009 showed no evidence of recurrent colon cancer. Restaging CT evaluation 08/01/2010 showed no evidence of recurrent colon cancer.  CT abdomen/pelvis-renal protocol  10/05/2014-negative for recurrent cancer. 2. Status post colonoscopy by Dr. Benson Norway 10/02/2008 with findings of a sessile polyp in the ascending colon and small hemorrhoids. The pathology from the polyp confirmed a tubular adenoma. Negative colonoscopy  11/03/2013  3. History of anemia. 4. Enlarged prevascular lymph node on CT 11/03/2006, stable on CT scans April 2009, March 2010 and March 2011. 5. History of renal insufficiency. 6. History of an elevated preoperative CEA. CEA was normal on  09/18/2014 7. 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  8. "Cystic" lesion of the subcutaneous tissue at the right lumbar level. A cyst has been present in this area dating back to CT scans from 2008. This is felt to likely be a benign finding. 9. Chronic back pain-followed by orthopedics.    Disposition:   Ms. Lathrop remains in clinical remission from colon cancer. She would like to continue follow-up at the [facility]. She will return for an office visit and CEA in one year. We will follow-up on the CEA from today.   there is a history of multiple cancers in her siblings. I made a referral to the genetics screening clinic.  Betsy Coder, MD  09/17/2015  12:14 PM

## 2015-09-17 NOTE — Telephone Encounter (Signed)
Gave and printed appt sched and avs for pt for June and May 2018

## 2015-09-18 ENCOUNTER — Telehealth: Payer: Self-pay | Admitting: *Deleted

## 2015-09-18 LAB — CEA (PARALLEL TESTING): CEA: 0.6 ng/mL

## 2015-09-18 LAB — CEA: CEA: 1.4 ng/mL (ref 0.0–4.7)

## 2015-09-18 NOTE — Telephone Encounter (Signed)
Called pt with normal CEA result. She voiced understanding.  Pt wanted to make Dr. Benay Spice aware that she also has a brother with metastatic lung cancer. This makes 7/10 of her siblings diagnosed with cancer.

## 2015-09-18 NOTE — Telephone Encounter (Signed)
-----   Message from Ladell Pier, MD sent at 09/18/2015  7:06 AM EDT ----- Please call patient, cea is normal

## 2015-10-22 ENCOUNTER — Ambulatory Visit (HOSPITAL_BASED_OUTPATIENT_CLINIC_OR_DEPARTMENT_OTHER): Payer: 59 | Admitting: Genetic Counselor

## 2015-10-22 ENCOUNTER — Other Ambulatory Visit (HOSPITAL_BASED_OUTPATIENT_CLINIC_OR_DEPARTMENT_OTHER): Payer: 59

## 2015-10-22 DIAGNOSIS — C184 Malignant neoplasm of transverse colon: Secondary | ICD-10-CM

## 2015-10-22 DIAGNOSIS — Z85528 Personal history of other malignant neoplasm of kidney: Secondary | ICD-10-CM

## 2015-10-22 DIAGNOSIS — Z800 Family history of malignant neoplasm of digestive organs: Secondary | ICD-10-CM

## 2015-10-22 DIAGNOSIS — Z809 Family history of malignant neoplasm, unspecified: Secondary | ICD-10-CM

## 2015-10-22 DIAGNOSIS — Z807 Family history of other malignant neoplasms of lymphoid, hematopoietic and related tissues: Secondary | ICD-10-CM

## 2015-10-22 DIAGNOSIS — Z85038 Personal history of other malignant neoplasm of large intestine: Secondary | ICD-10-CM

## 2015-10-22 DIAGNOSIS — Z803 Family history of malignant neoplasm of breast: Secondary | ICD-10-CM

## 2015-10-22 DIAGNOSIS — Z801 Family history of malignant neoplasm of trachea, bronchus and lung: Secondary | ICD-10-CM

## 2015-10-22 DIAGNOSIS — Z315 Encounter for genetic counseling: Secondary | ICD-10-CM

## 2015-10-22 DIAGNOSIS — Z8042 Family history of malignant neoplasm of prostate: Secondary | ICD-10-CM

## 2015-10-22 DIAGNOSIS — Z806 Family history of leukemia: Secondary | ICD-10-CM

## 2015-10-23 ENCOUNTER — Telehealth: Payer: Self-pay | Admitting: *Deleted

## 2015-10-23 ENCOUNTER — Encounter: Payer: Self-pay | Admitting: Genetic Counselor

## 2015-10-23 DIAGNOSIS — Z800 Family history of malignant neoplasm of digestive organs: Secondary | ICD-10-CM | POA: Insufficient documentation

## 2015-10-23 DIAGNOSIS — Z809 Family history of malignant neoplasm, unspecified: Secondary | ICD-10-CM | POA: Insufficient documentation

## 2015-10-23 LAB — CEA: CEA1: 1.4 ng/mL (ref 0.0–4.7)

## 2015-10-23 LAB — CEA (PARALLEL TESTING)

## 2015-10-23 NOTE — Telephone Encounter (Signed)
Per Dr. Benay Spice, pt notified that CEA is normal.  Patient has no question or concerns at this time and is appreciative of call.

## 2015-10-23 NOTE — Telephone Encounter (Signed)
-----   Message from Ladell Pier, MD sent at 10/23/2015  3:55 PM EDT ----- Please call patient, cea is normal

## 2015-10-23 NOTE — Progress Notes (Signed)
REFERRING PROVIDER: Ladell Pier., MD  PRIMARY PROVIDER:  Woody Seller, MD  PRIMARY REASON FOR VISIT:  1. History of colon cancer   2. History of renal cell cancer   3. Family history of colon cancer   4. Family history of pancreatic cancer   5. Family history of breast cancer in female   45. Family history of lung cancer   7. Family history of prostate cancer   8. Family history of leukemia   9. Family history of Hodgkin's lymphoma   10. Family history of cancer      HISTORY OF PRESENT ILLNESS:   Crystal York, a 65 y.o. female, was seen for a [place] cancer genetics consultation at the request of Dr. Benay Spice due to a personal history of renal and colon cancers and family history of colon, pancreatic, and other cancers.  Crystal York presents to clinic today to discuss the possibility of a hereditary predisposition to cancer, genetic testing, and to further clarify her future cancer risks, as well as potential cancer risks for family members.   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 This was treated with partial colectomy.  In May 2008, at the age of 48, Crystal York was also diagnosed with renal cell carcinoma, clear cell type, of the left kidney.  This was treated with left nephrectomy.    HORMONAL RISK FACTORS:  Menarche was at age 48.  First live birth at age 16.  OCP use for approximately one week  Ovaries intact: yes.  Hysterectomy: no.  Menopausal status: postmenopausal.  HRT use: 0 years. Colonoscopy: yes; history of colon cancer in 2008; 09/2008 - tubular adenoma; 10/2013 - normal colonoscopy/some diverticulitis. Mammogram within the last year: no, has not had one in 10 years. Number of breast biopsies: 0. Up to date with pelvic exams:  yes. Any excessive radiation/other exposures in the past:  no, but does report some history of additional secondhand smoke exposure  Past  Medical History  Diagnosis Date  . H/O seasonal allergies   . History of kidney stones   . Colon cancer [facility])     '08- chemotherapy after surgery  . Colon cancer ([place])   . Renal cancer [facility])     '08 left neprectomy  . Chronic back pain     chronic pain "deteriorating"  . Anemia     Past Surgical History  Procedure Laterality Date  . Hand surgery      carpel tunnel surgery both hands  . Gastric bypass  1982  . Kidney surgery    . Colon surgery    . Tonsillectomy  09/1969  . Hemorrhoid surgery  09/1969  . Tubal ligation    . Nephrectomy    . Bariatric surgery    . Carpal tunnel release    . Hemorrhoid surgery    . Radioactive therapy on back  2013  . Colposcopy    . Hernia repair  09/2006  . Colonoscopy with propofol N/A 11/03/2013    Procedure: COLONOSCOPY WITH PROPOFOL;  Surgeon: Beryle Beams, MD;  Location: [facility];  Service: Endoscopy;  Laterality: N/A;  . Hysteroscopy  10/2009    Endometrial polyp  . Radiology with anesthesia N/A 03/28/2015    Procedure: MRI LUMBER SPINE WITHOUT;  Surgeon: Medication Radiologist, MD;  Location: [place];  Service: Radiology;  Laterality: N/A;    Social History   Social History  .  Marital Status: Married    Spouse Name: N/A  . Number of Children: N/A  . Years of Education: N/A   Social History Main Topics  . Smoking status: Former Smoker -- 0.25 packs/day for 1.5 years    Types: Cigarettes    Quit date: 04/27/1995  . Smokeless tobacco: Never Used     Comment: when smoking, would sometimes go months w/o a cigarette  . Alcohol Use: No     Comment: maybe 2 beers in her whole life  . Drug Use: No  . Sexual Activity: No   Other Topics Concern  . None   Social History Narrative     FAMILY HISTORY:  We obtained a detailed, 4-generation family history.  Significant diagnoses are listed below: Family History  Problem Relation Age of Onset  . Diabetes Mother   . Hypertension Mother   . Colon cancer Sister 43  . Pancreatic  cancer Sister 23  . Lung cancer Sister 42    smoker  . Lung cancer Other     niece; w/ metastasis; smoker  . Colon cancer Other 56    niece w/ colon rupture - pt believes due to cancer  . Colon cancer Father 27    infection vs. colon cancer  . Heart Problems Father   . Rheum arthritis Maternal Grandmother   . Other Daughter     endometrial polyps  . Cancer Sister     NOS cancer; no further info  . Other Brother     NOS blood disorder  . Hodgkin's lymphoma Brother 15  . Lung cancer Brother   . Leukemia Brother   . Prostate cancer Other     nephew dx. late 36s  . Breast cancer Other     niece dx. 60    Crystal York has one daughter and one son, ages 63 and 28.  She has four granddaughters and one grandson.  Crystal York has three full sisters and six full brothers.  One sister died of pancreatic cancer at 30; she also had a history of colon cancer at age 67.  This sister has one daughter who was diagnosed with lung cancer and (Crystal York believes) colon cancer that led to a ruptured colon and her death at 6.  This same sister also had a son who was diagnosed with prostate cancer in his late 72s.  Another sister, a smoker, died of lung cancer at 48.  The remaining sister is currently 19 and reports to Crystal York that she has had cancer, but has not told her what type of cancer.  Two brothers are currently living and have never had cancer--one has a blood disorder of unspecified type.  Another brother has a history of Hodgkin's lymphoma diagnosed at 35.  Another brother died of lung cancer at 66.  The final brother died of leukemia in his late 49s.  This last brother has a daughter diagnosed with breast cancer at age 24.    Crystal York mother died of diabetes and cardiac arrest at 59.  Crystal York mother had three full brothers, all of whom died at older ages and never had cancer.  Crystal York has limited information for her maternal first cousins, but is unaware of any cancers.  Her maternal  grandmother died of [place] measles at 95.  She has no information for her maternal grandfather, maternal great aunts/uncles or maternal great grandparents.  Crystal York father died of either a stomach infection or colon cancer at 44.  Her father had one full sister and one full brother, both of whom died at later ages and never had cancer.  Crystal York has no information for her paternal first cousins.  Her paternal grandmother died in her late 66s.  She has no information for her grandfather, paternal great aunts/uncles, or great grandparents.  Crystal York is unaware of of any previous family history of genetic testing for hereditary cancer.  Patient's maternal and paternal ancestors are of Caucasian and Native American/[place]rokee descent. There is no reported Ashkenazi Jewish ancestry. There is no known consanguinity.  GENETIC COUNSELING ASSESSMENT: Crystal York is a 65 y.o. female with a personal and family history of cancer which is somewhat suggestive of a hereditary cancer syndrome, such as Lynch syndrome, and predisposition to cancer. We, therefore, discussed and recommended the following at today's visit.   DISCUSSION: We reviewed the characteristics, features and inheritance patterns of hereditary cancer syndromes, particularly those caused by mutations within the Lynch syndrome genes. We also discussed genetic testing, including the appropriate family members to test, the process of testing, insurance coverage and turn-around-time for results. We discussed the implications of a negative, positive and/or variant of uncertain significant result. We recommended Crystal York pursue genetic testing for a 20-gene Custom Cancer Panel with MSH2 Exons 1-7 Inversion Analysis through [facility].  This Custom Panel offered by [facility] includes sequencing and/or duplication/deletion testing of the following 20 genes: APC, ATM, AXIN2, BMPR1A, CDH1, CHEK2, EPCAM, MLH1, MSH2, MSH6, MUTYH, PMS2, POLD1, POLE, PTEN,  SCG5/GREM1, SMAD4, STK11, TP53, and VHL.  (The Colorectal Cancer Panel offered by [facility] with additional analysis of VHL gene.)  Based on Crystal York's personal and family history of cancer, she meets medical criteria for genetic testing. Despite that she meets criteria, she may still have an out of pocket cost. We discussed that if her out of pocket cost for testing is over $100, the laboratory will call and confirm whether she wants to proceed with testing.  If the out of pocket cost of testing is less than $100 she will be billed by the genetic testing laboratory.   PLAN: After considering the risks, benefits, and limitations, Crystal York  provided informed consent to pursue genetic testing and the blood sample was sent to [facility] for analysis of the 20-gene Custom Cancer Panel. Results should be available within approximately 2-3 weeks' time, at which point they will be disclosed by telephone to Crystal York [facility], as will any additional recommendations warranted by these results. Ms. Novakowski will receive a summary of her genetic counseling visit and a copy of her results once available. This information will also be available in Epic. We encouraged Ms. Pillay to remain in contact with cancer genetics annually so that we can continuously update the family history and inform her of any changes in cancer genetics and testing that may be of benefit for her family. Ms. Nave questions were answered to her satisfaction today. Our contact information was provided should additional questions or concerns arise.  Thank you for the referral and allowing [place] to share in the care of your patient.   Jeanine Luz, MS, [facility] Certified Genetic Counselor [email or web address] Phone: [phone]  The patient was seen for a total of 60 minutes in face-to-face genetic counseling.  This patient was discussed with Drs. Magrinat, Lindi Adie and/or Burr Medico who agrees with the above.     _______________________________________________________________________ For Office Staff:  Number of people involved in session: 2 Was an Intern/ student involved with case: yes

## 2015-12-06 ENCOUNTER — Telehealth: Payer: Self-pay | Admitting: Genetic Counselor

## 2015-12-06 NOTE — Telephone Encounter (Signed)
Discussed with Crystal York that her genetic test results were negative for mutations within any of 20 genes (Colorectal genes +VHL) on a Custom Panel through [facility].  Additionally, no uncertain changes were found.  Recommended she continue to follow her doctors recommendations for cancer screening in the future.  There is always a chance our testing may not be good enough currently to pick up on a genetic cause for the cancer history.  Men and women in the family are at a somewhat increased risk for colon cancer, simply due to the history.  Her son and daughter should make their primary doctors aware of this history, so that they may be best advised as to when to get their first colonoscopy.  Crystal York is welcome to call with any additional questions.

## 2015-12-09 ENCOUNTER — Ambulatory Visit: Payer: Self-pay | Admitting: Genetic Counselor

## 2015-12-09 DIAGNOSIS — Z800 Family history of malignant neoplasm of digestive organs: Secondary | ICD-10-CM

## 2015-12-09 DIAGNOSIS — Z85038 Personal history of other malignant neoplasm of large intestine: Secondary | ICD-10-CM

## 2015-12-09 DIAGNOSIS — Z1379 Encounter for other screening for genetic and chromosomal anomalies: Secondary | ICD-10-CM

## 2015-12-09 DIAGNOSIS — Z809 Family history of malignant neoplasm, unspecified: Secondary | ICD-10-CM

## 2015-12-09 DIAGNOSIS — Z85528 Personal history of other malignant neoplasm of kidney: Secondary | ICD-10-CM

## 2016-03-15 ENCOUNTER — Encounter: Payer: Self-pay | Admitting: Genetic Counselor

## 2016-03-15 DIAGNOSIS — Z1379 Encounter for other screening for genetic and chromosomal anomalies: Secondary | ICD-10-CM | POA: Insufficient documentation

## 2016-03-15 NOTE — Progress Notes (Signed)
GENETIC TEST RESULT  HPI: Ms. Riccardi was previously seen in the [facility] clinic due to a personal history of colon and renal cancers, family history of colon, pancreatic, and other cancers, and concerns regarding a hereditary predisposition to cancer. Please refer to our prior cancer genetics clinic note from October 22, 2015 for more information regarding Ms. Schlup's medical, social and family histories, and our assessment and recommendations, at the time. Ms. Quarry recent genetic test results were disclosed to her, as were recommendations warranted by these results. These results and recommendations are discussed in more detail below.  GENETIC TEST RESULTS: At the time of Ms. Amos's visit on 10/22/15, we recommended she pursue genetic testing of the 20-gene Custom Cancer Panel (Colorectal Cancer Panel genes plus VHL gene) with MSH2 Exons 1-7 Inversion Analysis through [facility]. This Custom Panel offered by [facility] includes sequencing and/or duplication/deletion testing of the following 10 genes: APC, ATM, AXIN2, BMPR1A, CDH1, CHEK2, EPCAM, MLH1, MSH2, MSH6, MUTYH, PMS2, POLD1, POLE, PTEN, SCG5/GREM1, SMAD4, STK11, TP53, and VHL.  Those results are now back, the report date for which is December 05, 2015.  Genetic testing was normal, and did not reveal a deleterious mutation in these genes.  Additionally, no variants of uncertain significance (VUSes) were found.  The test report will be scanned into EPIC and will be located under the Results Review tab in the Pathology>Molecular Pathology section.   We discussed with Ms. Sundstrom that since the current genetic testing is not perfect, it is possible there may be a gene mutation in one of these genes that current testing cannot detect, but that chance is small. We also discussed, that it is possible that another gene that has not yet been discovered, or that we have not yet tested, is responsible for the cancer diagnoses in the family, and  it is, therefore, important to remain in touch with cancer genetics in the future so that we can continue to offer Ms. Medlin the most up-to-date genetic testing.   CANCER SCREENING RECOMMENDATIONS: While we still do not have an explanation for the personal and family history of cancer, this result seems to be reassuring and indicate that Ms. Belsky likely does not have an increased risk for a future cancer due to a mutation in one of these genes. This normal test also suggests that Ms. Petralia's cancer was most likely not due to an inherited predisposition associated with one of these genes.  Most cancers happen by chance and this negative test suggests that her cancer falls into this category.  We, therefore, recommended she continue to follow the cancer management and screening guidelines provided by her oncology and primary healthcare providers.   RECOMMENDATIONS FOR FAMILY MEMBERS:Men and women in this family might be at some increased risk of developing cancer, over the general population risk, simply due to the family history of cancer. We recommended women in this family have a yearly mammogram beginning at age 66, or 33 years younger than the earliest onset of cancer, an annual clinical breast exam, and perform monthly breast self-exams. Women in this family should also have a gynecological exam as recommended by their primary provider. All family members should have a colonoscopy by age 37, or earlier as recommended by their doctors.  Current [place] (NCCN) guidelines now recommend that individuals with a first-degree relative with a history of colon cancer at any age should begin getting colonoscopies at the age of 19 or ten years earlier than  the youngest diagnosis of colon cancer, and they should get these every 5 years.  FOLLOW-UP: Lastly, we discussed with Ms. Barton that cancer genetics is a rapidly advancing field and it is possible that new genetic tests will be  appropriate for her and/or her family members in the future. We encouraged her to remain in contact with cancer genetics on an annual basis so we can update her personal and family histories and let her know of advances in cancer genetics that may benefit this family.   Our contact number was provided. Ms. Kaleta questions were answered to her satisfaction, and she knows she is welcome to call us at anytime with additional questions or concerns.   Jeanine Luz, MS, [facility] Certified Genetic Counselor [email or web address] Phone: [phone]

## 2016-03-27 ENCOUNTER — Other Ambulatory Visit: Payer: Self-pay | Admitting: Gynecology

## 2016-03-27 DIAGNOSIS — Z1231 Encounter for screening mammogram for malignant neoplasm of breast: Secondary | ICD-10-CM

## 2016-04-21 ENCOUNTER — Ambulatory Visit (INDEPENDENT_AMBULATORY_CARE_PROVIDER_SITE_OTHER): Payer: Medicare HMO | Admitting: Gynecology

## 2016-04-21 ENCOUNTER — Encounter: Payer: Self-pay | Admitting: Gynecology

## 2016-04-21 ENCOUNTER — Other Ambulatory Visit: Payer: Self-pay | Admitting: Gynecology

## 2016-04-21 VITALS — BP 128/78 | Ht 65.0 in | Wt 341.0 lb

## 2016-04-21 DIAGNOSIS — Z01411 Encounter for gynecological examination (general) (routine) with abnormal findings: Secondary | ICD-10-CM

## 2016-04-21 DIAGNOSIS — N952 Postmenopausal atrophic vaginitis: Secondary | ICD-10-CM

## 2016-04-21 NOTE — Progress Notes (Signed)
    Crystal York 09/01/1950 NP:[id number]        65 y.o.  G2P2  for breast and pelvic exam.  Past medical history,surgical history, problem list, medications, allergies, family history and social history were all reviewed and documented as reviewed in the EPIC chart.  ROS:  Performed with pertinent positives and negatives included in the history, assessment and plan.   Additional significant findings :  None   Exam: Caryn Bee assistant Vitals:   04/21/16 1357  BP: 128/78  Weight: (!) 341 lb (154.7 kg)  Height: 5\' 5"  (1.651 m)   Body mass index is 56.75 kg/m.  General appearance:  Normal affect, orientation and appearance. Skin: Grossly normal HEENT: Without gross lesions.  No cervical or supraclavicular adenopathy. Thyroid normal.  Lungs:  Clear without wheezing, rales or rhonchi Cardiac: RR, without RMG Abdominal:  Soft, nontender, without masses, guarding, rebound, organomegaly or hernia Breasts:  Examined lying and sitting without masses, retractions, discharge or axillary adenopathy. Pelvic:  Ext, BUS, Vagina with atrophic changes  Cervix with atrophic changes  Uterus difficult to palpate but no gross masses or tenderness  Adnexa without gross masses or tenderness    Anus and perineum normal   Rectovaginal normal sphincter tone without palpated masses or tenderness.    Assessment/Plan:  66 y.o. G2P2 female for breast and pelvic exam.   1. Postmenopausal/atrophic genital changes. No significant hot flushes, night sweats, vaginal dryness or any vaginal bleeding. Continue to monitor and report any issues or vaginal bleeding. 2. Pap smear 2015. No Pap smear done today. No history of significant abnormal Pap smears. Plan Pap smear repeat next year at 3 year interval. 3. Mammogram reportedly scheduled next week. Last mammogram 2001. I stressed the need to follow up for her mammogram as scheduled. SBE monthly reviewed. 4. Colonoscopy 2015. Repeat at their recommended interval  with history of colon cancer. 5. DEXA never. Recommend baseline DEXA now at age 42 and she agrees to schedule. 6. Health maintenance. No routine blood work done as patient reports this done elsewhere. Follow up for DEXA and mammogram otherwise in one year, sooner as needed.   Anastasio Auerbach MD, 2:52 PM 04/21/2016

## 2016-04-21 NOTE — Patient Instructions (Signed)
Follow up for the bone density as scheduled.  Follow up for your mammogram as scheduled.

## 2016-04-22 LAB — URINALYSIS W MICROSCOPIC + REFLEX CULTURE
Bilirubin Urine: NEGATIVE
Casts: NONE SEEN [LPF]
Crystals: NONE SEEN [HPF]
GLUCOSE, UA: NEGATIVE
Hgb urine dipstick: NEGATIVE
Ketones, ur: NEGATIVE
Nitrite: NEGATIVE
PROTEIN: NEGATIVE
RBC / HPF: NONE SEEN RBC/HPF (ref <=2)
Specific Gravity, Urine: 1.012 (ref 1.001–1.035)
YEAST: NONE SEEN [HPF]
pH: 6 (ref 5.0–8.0)

## 2016-04-24 LAB — URINE CULTURE

## 2016-04-27 ENCOUNTER — Telehealth: Payer: Self-pay | Admitting: *Deleted

## 2016-04-27 ENCOUNTER — Other Ambulatory Visit: Payer: Self-pay | Admitting: Gynecology

## 2016-04-27 MED ORDER — SULFAMETHOXAZOLE-TRIMETHOPRIM 800-160 MG PO TABS: 1.0000 | ORAL_TABLET | Freq: Two times a day (BID) | ORAL | 0 refills | Status: DC

## 2016-04-27 MED ORDER — AMOXICILLIN-POT CLAVULANATE 500-125 MG PO TABS: ORAL_TABLET | ORAL | 0 refills | Status: DC

## 2016-04-27 NOTE — Telephone Encounter (Signed)
[facility] called regarding Rx for septra ds that was sent today, pharmacist said patient has sulfa allergies and unable to take medication, asked if another Rx could be prescribed? pharmacist said she did confirm this allergy with patient. I will put this in patient allergy list as well. Please advise

## 2016-04-27 NOTE — Telephone Encounter (Signed)
New Rx sent.

## 2016-04-27 NOTE — Telephone Encounter (Signed)
Augmentin 500 #10 one by mouth twice a day 5 days

## 2016-04-29 ENCOUNTER — Ambulatory Visit (INDEPENDENT_AMBULATORY_CARE_PROVIDER_SITE_OTHER): Payer: Medicare HMO | Admitting: Urology

## 2016-04-29 DIAGNOSIS — R351 Nocturia: Secondary | ICD-10-CM

## 2016-04-29 DIAGNOSIS — C642 Malignant neoplasm of left kidney, except renal pelvis: Secondary | ICD-10-CM | POA: Diagnosis not present

## 2016-04-29 DIAGNOSIS — N3020 Other chronic cystitis without hematuria: Secondary | ICD-10-CM

## 2016-05-01 ENCOUNTER — Ambulatory Visit
Admission: RE | Admit: 2016-05-01 | Discharge: 2016-05-01 | Disposition: A | Discharge status: Home / Self Care | Payer: Medicare HMO | Source: Ambulatory Visit | Attending: Gynecology | Admitting: Gynecology

## 2016-05-01 ENCOUNTER — Other Ambulatory Visit: Payer: Self-pay | Admitting: Urology

## 2016-05-01 DIAGNOSIS — Z1231 Encounter for screening mammogram for malignant neoplasm of breast: Secondary | ICD-10-CM

## 2016-05-01 DIAGNOSIS — C642 Malignant neoplasm of left kidney, except renal pelvis: Secondary | ICD-10-CM

## 2016-05-06 ENCOUNTER — Ambulatory Visit (HOSPITAL_COMMUNITY)
Admission: RE | Admit: 2016-05-06 | Discharge: 2016-05-06 | Disposition: A | Discharge status: Home / Self Care | Payer: Medicare HMO | Source: Ambulatory Visit | Attending: Urology | Admitting: Urology

## 2016-05-06 DIAGNOSIS — C642 Malignant neoplasm of left kidney, except renal pelvis: Secondary | ICD-10-CM | POA: Diagnosis present

## 2016-05-06 DIAGNOSIS — Z905 Acquired absence of kidney: Secondary | ICD-10-CM | POA: Diagnosis not present

## 2016-05-07 ENCOUNTER — Telehealth: Payer: Self-pay | Admitting: *Deleted

## 2016-05-07 MED ORDER — FLUCONAZOLE 150 MG PO TABS: 150.0000 mg | ORAL_TABLET | Freq: Every day | ORAL | 0 refills | Status: DC

## 2016-05-07 NOTE — Telephone Encounter (Signed)
Okay for Diflucan 150 mg 3 days

## 2016-05-07 NOTE — Telephone Encounter (Signed)
Pt was prescribed Augmentin 500 mg x 5 days on 04/27/16,pt now has yeast infection vaginal itching asked if diflucan #3 tablets could be prescribed? States typically 1 pill doesn't work. Please advise

## 2016-05-07 NOTE — Telephone Encounter (Signed)
Left message on pt voicemail Rx has been sent.  

## 2016-09-14 ENCOUNTER — Other Ambulatory Visit: Payer: Self-pay | Admitting: *Deleted

## 2016-09-14 DIAGNOSIS — C184 Malignant neoplasm of transverse colon: Secondary | ICD-10-CM

## 2016-09-15 ENCOUNTER — Ambulatory Visit (HOSPITAL_BASED_OUTPATIENT_CLINIC_OR_DEPARTMENT_OTHER): Payer: Medicare HMO | Admitting: Oncology

## 2016-09-15 ENCOUNTER — Other Ambulatory Visit (HOSPITAL_BASED_OUTPATIENT_CLINIC_OR_DEPARTMENT_OTHER): Payer: Medicare HMO

## 2016-09-15 VITALS — BP 139/70 | HR 65 | Temp 98.1°F | Resp 18 | Ht 65.0 in | Wt 340.5 lb

## 2016-09-15 DIAGNOSIS — C184 Malignant neoplasm of transverse colon: Secondary | ICD-10-CM

## 2016-09-15 DIAGNOSIS — M545 Low back pain: Secondary | ICD-10-CM

## 2016-09-15 DIAGNOSIS — Z85038 Personal history of other malignant neoplasm of large intestine: Secondary | ICD-10-CM | POA: Diagnosis not present

## 2016-09-15 DIAGNOSIS — G8929 Other chronic pain: Secondary | ICD-10-CM

## 2016-09-15 DIAGNOSIS — Z85528 Personal history of other malignant neoplasm of kidney: Secondary | ICD-10-CM

## 2016-09-15 LAB — CEA (IN HOUSE-CHCC)

## 2016-09-15 NOTE — Progress Notes (Signed)
  [facility] OFFICE PROGRESS NOTE   Diagnosis: Colon cancer  INTERVAL HISTORY:   Crystal York returns as scheduled. She has chronic back pain and has been referred to a pain clinic. No difficulty with bowel function. No bleeding. She continues follow-up at the urology Center for the history of renal cell carcinoma.   Objective:  Vital signs in last 24 hours:  Blood pressure 139/70, pulse 65, temperature 98.1 F (36.7 C), temperature source Oral, resp. rate 18, height 5\' 5"  (1.651 m), weight (!) 340 lb 8 oz (154.4 kg), last menstrual period 10/01/2010, SpO2 98 %.    HEENT: Neck without mass Lymphatics: No cervical, supraclavicular, axillary, or inguinal nodes Resp: Lungs clear bilaterally Cardio: Regular rate and rhythm GI: No hepato-or splenomegaly, no abdominal mass, one half-1 m area of nodularity in the left abdominal subcutaneous tissue Vascular: Chronic stasis change at the lower leg bilaterally  Skin: No palpable "cyst "at the right lumbar area    Lab Results:    Lab Results  Component Value Date   CEA1 1.4 10/22/2015     Medications: I have reviewed the patient's current medications.  Assessment/Plan: 1. Stage IIIC (T4 ,N2) colon cancer, status post partial colectomy 10/04/2006, followed by 12 cycles of adjuvant chemotherapy. Restaging CT evaluation 07/31/2009 showed no evidence of recurrent colon cancer. Restaging CT evaluation 08/01/2010 showed no evidence of recurrent colon cancer.  CT abdomen/pelvis-renal protocol  10/05/2014-negative for recurrent cancer. 2. Status post colonoscopy by Dr. Benson Norway 10/02/2008 with findings of a sessile polyp in the ascending colon and small hemorrhoids. The pathology from the polyp confirmed a tubular adenoma. Negative colonoscopy 11/03/2013  3. History of anemia. 4. Enlarged prevascular lymph node on CT 11/03/2006, stable on CT scans April 2009, March 2010 and March 2011. 5. History of renal  insufficiency. 6. History of an elevated preoperative CEA. CEA was normal on   10/22/2015 7. 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  8. "Cystic" lesion of the subcutaneous tissue at the right lumbar level. A cyst has been present in this area dating back to CT scans from 2008. This is felt to likely be a benign finding. 9. Chronic back pain-followed by orthopedics   Disposition:  [name] remains in clinical remission from colon cancer. She would like to continue follow-up at the [facility]. She will return for an office and lab visit in one year. She continues surveillance colonoscopies with Dr. Benson Norway. 15 minutes were spent with the patient today. The majority of the time was used for counseling and coordination of care. Betsy Coder, MD  09/15/2016  1:14 PM

## 2016-09-16 ENCOUNTER — Telehealth: Payer: Self-pay | Admitting: *Deleted

## 2016-09-16 LAB — CEA: CEA1: 1.4 ng/mL (ref 0.0–4.7)

## 2016-09-16 NOTE — Telephone Encounter (Signed)
-----   Message from Ladell Pier, MD sent at 09/16/2016  3:06 PM EDT ----- Please call patient, cea is normal

## 2016-09-16 NOTE — Telephone Encounter (Signed)
Patient notified that cea is normal per order of Dr. Benay Spice.  Patient appreciative of call and has no questions at this time.

## 2016-09-18 ENCOUNTER — Telehealth: Payer: Self-pay | Admitting: Oncology

## 2016-09-18 NOTE — Telephone Encounter (Signed)
Patient bypassed scheduling on 09/15/16.  Appointments scheduled per 09/15/16 los. Appointment letter and schedule mailed to patient.

## 2016-11-20 ENCOUNTER — Encounter (HOSPITAL_COMMUNITY): Payer: Self-pay

## 2017-04-06 DIAGNOSIS — L719 Rosacea, unspecified: Secondary | ICD-10-CM | POA: Insufficient documentation

## 2017-04-06 DIAGNOSIS — K219 Gastro-esophageal reflux disease without esophagitis: Secondary | ICD-10-CM | POA: Insufficient documentation

## 2017-04-17 DIAGNOSIS — R87610 Atypical squamous cells of undetermined significance on cytologic smear of cervix (ASC-US): Secondary | ICD-10-CM

## 2017-04-17 HISTORY — DX: Atypical squamous cells of undetermined significance on cytologic smear of cervix (ASC-US): R87.610

## 2017-04-23 ENCOUNTER — Ambulatory Visit: Payer: Medicare HMO | Admitting: Gynecology

## 2017-04-23 ENCOUNTER — Encounter: Payer: Self-pay | Admitting: Gynecology

## 2017-04-23 VITALS — BP 130/84 | Ht 64.0 in | Wt 345.0 lb

## 2017-04-23 DIAGNOSIS — Z01411 Encounter for gynecological examination (general) (routine) with abnormal findings: Secondary | ICD-10-CM

## 2017-04-23 DIAGNOSIS — N760 Acute vaginitis: Secondary | ICD-10-CM

## 2017-04-23 DIAGNOSIS — N952 Postmenopausal atrophic vaginitis: Secondary | ICD-10-CM

## 2017-04-23 DIAGNOSIS — Z124 Encounter for screening for malignant neoplasm of cervix: Secondary | ICD-10-CM

## 2017-04-23 MED ORDER — FLUCONAZOLE 150 MG PO TABS: 150.0000 mg | ORAL_TABLET | Freq: Every day | ORAL | 0 refills | Status: DC

## 2017-04-23 NOTE — Addendum Note (Signed)
Addended by: Nelva Nay on: 04/23/2017 12:36 PM   Modules accepted: Orders

## 2017-04-23 NOTE — Patient Instructions (Addendum)
Followup for bone density as scheduled. Followup in one year for annual exam 

## 2017-04-23 NOTE — Progress Notes (Signed)
    Crystal York 02/27/1951 [id number]        66 y.o.  G2P2 for annual gynecologic exam.  Doing well without gynecologic complaints  Past medical history,surgical history, problem list, medications, allergies, family history and social history were all reviewed and documented as reviewed in the EPIC chart.  ROS:  Performed with pertinent positives and negatives included in the history, assessment and plan.   Additional significant findings : None   Exam: Caryn Bee assistant Vitals:   04/23/17 1201  BP: 130/84  Weight: (!) 345 lb (156.5 kg)  Height: 5\' 4"  (1.626 m)   Body mass index is 59.22 kg/m.  General appearance:  Normal affect, orientation and appearance. Skin: Grossly normal HEENT: Without gross lesions.  No cervical or supraclavicular adenopathy. Thyroid normal.  Lungs:  Clear without wheezing, rales or rhonchi Cardiac: RR, without RMG Abdominal:  Soft, nontender, without masses, guarding, rebound, organomegaly or hernia Breasts:  Examined lying and sitting without masses, retractions, discharge or axillary adenopathy. Pelvic:  Ext, BUS, Vagina: With atrophic changes  Cervix: With atrophic changes.  Pap smear done  Uterus: Unable to palpate but no gross masses or tenderness  Adnexa: Without gross masses or tenderness    Anus and perineum: Normal   Rectovaginal: Normal sphincter tone without palpated masses or tenderness.    Assessment/Plan:  66 y.o. G2P2 female for annual gynecologic exam.   1. Postmenopausal/atrophic genital changes.  No significant hot flushes, night sweats, vaginal dryness or bleeding.  Continue to monitor and report any issues or bleeding. 2. Pap smear 2015.  Pap smear done today.  No history of abnormal Pap smears previously. 3. Mammography due now when she is going to call and schedule.  Breast exam normal today. 4. Colonoscopy 2015.  Repeat at their recommended interval. 5. DEXA never.  She was to schedule last year but never followed up  for this.  Emphasized the need to schedule this year and she agrees to do so. 6. Health maintenance.  No routine lab work done as she does this elsewhere.  Follow-up 1 year, sooner as needed.   Anastasio Auerbach MD, 12:26 PM 04/23/2017

## 2017-04-29 LAB — PAP IG W/ RFLX HPV ASCU

## 2017-04-29 LAB — HUMAN PAPILLOMAVIRUS, HIGH RISK: HPV DNA HIGH RISK: NOT DETECTED

## 2017-05-03 ENCOUNTER — Encounter: Payer: Self-pay | Admitting: Gynecology

## 2017-05-05 ENCOUNTER — Ambulatory Visit: Payer: Medicare HMO | Admitting: Urology

## 2017-05-05 DIAGNOSIS — N3020 Other chronic cystitis without hematuria: Secondary | ICD-10-CM | POA: Diagnosis not present

## 2017-05-05 DIAGNOSIS — R351 Nocturia: Secondary | ICD-10-CM | POA: Diagnosis not present

## 2017-05-20 DIAGNOSIS — Z85038 Personal history of other malignant neoplasm of large intestine: Secondary | ICD-10-CM | POA: Insufficient documentation

## 2017-05-20 DIAGNOSIS — R42 Dizziness and giddiness: Secondary | ICD-10-CM | POA: Insufficient documentation

## 2017-05-23 DIAGNOSIS — F418 Other specified anxiety disorders: Secondary | ICD-10-CM | POA: Insufficient documentation

## 2017-06-18 ENCOUNTER — Other Ambulatory Visit: Payer: Self-pay | Admitting: Gynecology

## 2017-06-18 DIAGNOSIS — Z1231 Encounter for screening mammogram for malignant neoplasm of breast: Secondary | ICD-10-CM

## 2017-07-08 ENCOUNTER — Ambulatory Visit
Admission: RE | Admit: 2017-07-08 | Discharge: 2017-07-08 | Disposition: A | Discharge status: Home / Self Care | Payer: Medicare HMO | Source: Ambulatory Visit | Attending: Gynecology | Admitting: Gynecology

## 2017-07-08 DIAGNOSIS — Z1231 Encounter for screening mammogram for malignant neoplasm of breast: Secondary | ICD-10-CM

## 2017-09-13 ENCOUNTER — Telehealth: Payer: Self-pay | Admitting: Oncology

## 2017-09-13 NOTE — Telephone Encounter (Signed)
Called pt re appt being moved due to [facility] - spoke w/ pt re appt.

## 2017-09-17 ENCOUNTER — Other Ambulatory Visit: Payer: Medicare HMO

## 2017-09-17 ENCOUNTER — Ambulatory Visit: Payer: Medicare HMO | Admitting: Oncology

## 2017-09-30 ENCOUNTER — Other Ambulatory Visit: Payer: Self-pay | Admitting: *Deleted

## 2017-09-30 DIAGNOSIS — C187 Malignant neoplasm of sigmoid colon: Secondary | ICD-10-CM

## 2017-10-01 ENCOUNTER — Inpatient Hospital Stay (HOSPITAL_BASED_OUTPATIENT_CLINIC_OR_DEPARTMENT_OTHER): Payer: Medicare HMO | Admitting: Oncology

## 2017-10-01 ENCOUNTER — Inpatient Hospital Stay: Payer: Medicare HMO | Attending: Oncology

## 2017-10-01 ENCOUNTER — Telehealth: Payer: Self-pay | Admitting: Oncology

## 2017-10-01 ENCOUNTER — Telehealth: Payer: Self-pay | Admitting: *Deleted

## 2017-10-01 VITALS — BP 179/64 | HR 74 | Temp 97.9°F | Resp 18 | Ht 64.0 in | Wt 363.6 lb

## 2017-10-01 DIAGNOSIS — M7989 Other specified soft tissue disorders: Secondary | ICD-10-CM | POA: Diagnosis not present

## 2017-10-01 DIAGNOSIS — M549 Dorsalgia, unspecified: Secondary | ICD-10-CM | POA: Diagnosis not present

## 2017-10-01 DIAGNOSIS — G8929 Other chronic pain: Secondary | ICD-10-CM

## 2017-10-01 DIAGNOSIS — R609 Edema, unspecified: Secondary | ICD-10-CM | POA: Diagnosis not present

## 2017-10-01 DIAGNOSIS — Z9221 Personal history of antineoplastic chemotherapy: Secondary | ICD-10-CM | POA: Diagnosis not present

## 2017-10-01 DIAGNOSIS — D649 Anemia, unspecified: Secondary | ICD-10-CM | POA: Insufficient documentation

## 2017-10-01 DIAGNOSIS — N2889 Other specified disorders of kidney and ureter: Secondary | ICD-10-CM

## 2017-10-01 DIAGNOSIS — C187 Malignant neoplasm of sigmoid colon: Secondary | ICD-10-CM

## 2017-10-01 DIAGNOSIS — Z85038 Personal history of other malignant neoplasm of large intestine: Secondary | ICD-10-CM | POA: Diagnosis not present

## 2017-10-01 LAB — BASIC METABOLIC PANEL - CANCER CENTER ONLY
Anion gap: 5 (ref 3–11)
BUN: 12 mg/dL (ref 7–26)
CALCIUM: 9.1 mg/dL (ref 8.4–10.4)
CO2: 29 mmol/L (ref 22–29)
CREATININE: 1.24 mg/dL — AB (ref 0.60–1.10)
Chloride: 108 mmol/L (ref 98–109)
GFR, EST AFRICAN AMERICAN: 51 mL/min — AB (ref >60)
GFR, Estimated: 44 mL/min — ABNORMAL LOW (ref >60)
GLUCOSE: 94 mg/dL (ref 70–140)
Potassium: 4.3 mmol/L (ref 3.5–5.1)
Sodium: 142 mmol/L (ref 136–145)

## 2017-10-01 LAB — CEA (IN HOUSE-CHCC): CEA (CHCC-In House): <1 ng/mL (ref 0.00–5.00)

## 2017-10-01 NOTE — Progress Notes (Signed)
  [place] OFFICE PROGRESS NOTE   Diagnosis: Colon cancer   INTERVAL HISTORY:   Ms. Leder returns as scheduled.  She has chronic lower back pain.  She is followed in a pain clinic.  She received spine injections approximately every 3 months.  She has chronic edema and erythema at the lower legs.  No difficulty with bowel function.  She has a "knot "that is tender at the lower back.  Objective:  Vital signs in last 24 hours:  Blood pressure (!) 179/64, pulse 74, temperature 97.9 F (36.6 C), temperature source Oral, resp. rate 18, height 5\' 4"  (1.626 m), weight (!) 363 lb 9.6 oz (164.9 kg), last menstrual period 10/01/2010, SpO2 99 %.    HEENT: Neck without mass Lymphatics: No cervical, supraclavicular, axillary, or inguinal nodes Resp: Lungs clear bilaterally Cardio: Regular rate and rhythm GI: No hepatosplenomegaly, nontender Vascular: 1+ edema at the lower leg bilaterally with chronic stasis change Musculoskeletal: The area of discomfort is at the low back near the sacrum.  She would not allow examination of the lower back and paraspinous areas   Lab Results  Component Value Date   CEA1 <1.00 09/15/2016   CEA1 1.4 09/15/2016     Medications: I have reviewed the patient's current medications.   Assessment/Plan: 1. Stage IIIC (T4 ,N2) colon cancer, status post partial colectomy 10/04/2006, followed by 12 cycles of adjuvant chemotherapy. Restaging CT evaluation 07/31/2009 showed no evidence of recurrent colon cancer. Restaging CT evaluation 08/01/2010 showed no evidence of recurrent colon cancer.  CT abdomen/pelvis-renal protocol 10/05/2014-negative for recurrent cancer. 2. Status post colonoscopy by Dr. Benson Norway 10/02/2008 with findings of a sessile polyp in the ascending colon and small hemorrhoids. The pathology from the polyp confirmed a tubular adenoma. Negative colonoscopy 11/03/2013  3. History of anemia. 4. Enlarged prevascular lymph node on CT  11/03/2006, stable on CT scans April 2009, March 2010 and March 2011. 5. History of renal insufficiency. 6. History of an elevated preoperative CEA. CEA was normal on  10/22/2015 7. 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  8. "Cystic" lesion of the subcutaneous tissue at the right lumbar level. A cyst has been present in this area dating back to CT scans from 2008. This is felt to likely be a benign finding. 9. Chronic back pain-followed by a pain clinic   Disposition: Ms. Rohner remains in clinical remission from colon cancer.  She would like to continue clinical and CEA follow-up at the Cancer center.  We will follow-up on the CEA from today.  She will return for an office visit and CEA in 1 year. She will be due for a colonoscopy with Dr. Deatra Ina next year.  15 minutes were spent with the patient today.  The majority of the time was used for counseling and coordination of care.   Betsy Coder, MD  10/01/2017  12:21 PM

## 2017-10-01 NOTE — Telephone Encounter (Signed)
Notified pt of normal CEA. She voiced appreciation for call.

## 2017-10-01 NOTE — Telephone Encounter (Signed)
Scheduled appt per 5/17 los - Gave patient aVS and calender per los.

## 2017-10-01 NOTE — Telephone Encounter (Signed)
-----   Message from Ladell Pier, MD sent at 10/01/2017  2:00 PM EDT ----- Please call patient, cea is normal

## 2017-11-10 ENCOUNTER — Ambulatory Visit: Payer: 59 | Admitting: Urology

## 2017-11-10 DIAGNOSIS — R351 Nocturia: Secondary | ICD-10-CM | POA: Diagnosis not present

## 2017-11-10 DIAGNOSIS — N3020 Other chronic cystitis without hematuria: Secondary | ICD-10-CM | POA: Diagnosis not present

## 2018-01-05 ENCOUNTER — Other Ambulatory Visit: Payer: Self-pay | Admitting: Gynecology

## 2018-04-26 ENCOUNTER — Encounter: Payer: Medicare HMO | Admitting: Gynecology

## 2018-04-28 ENCOUNTER — Ambulatory Visit: Payer: Medicare HMO | Admitting: Gynecology

## 2018-04-28 ENCOUNTER — Encounter: Payer: Self-pay | Admitting: Gynecology

## 2018-04-28 VITALS — BP 144/84 | Ht 63.0 in | Wt 359.0 lb

## 2018-04-28 DIAGNOSIS — Z01419 Encounter for gynecological examination (general) (routine) without abnormal findings: Secondary | ICD-10-CM

## 2018-04-28 DIAGNOSIS — R030 Elevated blood-pressure reading, without diagnosis of hypertension: Secondary | ICD-10-CM

## 2018-04-28 DIAGNOSIS — Z9189 Other specified personal risk factors, not elsewhere classified: Secondary | ICD-10-CM

## 2018-04-28 DIAGNOSIS — Z124 Encounter for screening for malignant neoplasm of cervix: Secondary | ICD-10-CM

## 2018-04-28 DIAGNOSIS — N952 Postmenopausal atrophic vaginitis: Secondary | ICD-10-CM

## 2018-04-28 DIAGNOSIS — N898 Other specified noninflammatory disorders of vagina: Secondary | ICD-10-CM

## 2018-04-28 DIAGNOSIS — R87610 Atypical squamous cells of undetermined significance on cytologic smear of cervix (ASC-US): Secondary | ICD-10-CM

## 2018-04-28 MED ORDER — TERCONAZOLE 0.4 % VA CREA: 1.0000 | TOPICAL_CREAM | Freq: Every day | VAGINAL | 0 refills | Status: DC

## 2018-04-28 NOTE — Patient Instructions (Signed)
Your blood pressure today was 144/84.  I would make sure that you have this repeated.  If it continues to be elevated then I would follow-up with your primary physician for ongoing management.  Use the Terazol vaginal cream nightly as needed for irritation.  Follow-up if this continues to be an issue.  The office will contact you as far as arranging for the bone density.

## 2018-04-28 NOTE — Progress Notes (Signed)
    Crystal York Nov 05, 1950 [id number]        67 y.o.  G2P2 for breast and pelvic exam.  Notes some vaginal irritation.  No significant odor or discharge.  No urinary symptoms such as frequency dysuria urgency low back pain fever or chills.  Past medical history,surgical history, problem list, medications, allergies, family history and social history were all reviewed and documented as reviewed in the EPIC chart.  ROS:  Performed with pertinent positives and negatives included in the history, assessment and plan.   Additional significant findings : None   Exam: Caryn Bee assistant Vitals:   04/28/18 1240  BP: (!) 144/84  Weight: (!) 359 lb (162.8 kg)  Height: 5\' 3"  (1.6 m)   Body mass index is 63.59 kg/m.  General appearance:  Normal affect, orientation and appearance. Skin: Grossly normal HEENT: Without gross lesions.  No cervical or supraclavicular adenopathy. Thyroid normal.  Lungs:  Clear without wheezing, rales or rhonchi Cardiac: RR, without RMG Abdominal:  Soft, nontender, without masses, guarding, rebound, organomegaly or hernia Breasts:  Examined lying and sitting without masses, retractions, discharge or axillary adenopathy. Pelvic:  Ext, BUS, Vagina: With atrophic changes.  Scant discharge noted  Cervix: Normal.  Pap smear done  Uterus: Unable to palpate.  No gross masses or tenderness  Adnexa: Without masses or tenderness    Anus and perineum: Normal   Rectovaginal: Normal sphincter tone without palpated masses or tenderness.    Assessment/Plan:  67 y.o. G2P2 female for breast and pelvic exam.   1. Postmenopausal.  No significant menopausal symptoms or any vaginal bleeding. 2. Colonoscopy 2015.  Repeat at their recommended interval. 3. Mammography 06/2017.  Continue with annual mammography when due.  Breast exam normal today. 4. Pap smear 2018 ASCUS with negative high risk HPV.  Pap smear done today.  No history of abnormal Pap smears previously. 5. DEXA  never.  We again discussed baseline DEXA and the need to schedule.  Will help patient make these arrangements. 6. Health maintenance.  No routine lab work done as patient does this elsewhere.  Follow-up 1 year, sooner as needed.   Anastasio Auerbach MD, 1:12 PM 04/28/2018

## 2018-04-28 NOTE — Addendum Note (Signed)
Addended by: Nelva Nay on: 04/28/2018 01:22 PM   Modules accepted: Orders

## 2018-04-29 ENCOUNTER — Other Ambulatory Visit: Payer: Self-pay | Admitting: *Deleted

## 2018-04-29 DIAGNOSIS — Z780 Asymptomatic menopausal state: Secondary | ICD-10-CM

## 2018-04-29 LAB — WET PREP FOR TRICH, YEAST, CLUE

## 2018-04-29 NOTE — Addendum Note (Signed)
Addended by: Joaquin Music on: 04/29/2018 09:44 AM   Modules accepted: Orders

## 2018-04-29 NOTE — Addendum Note (Signed)
Addended by: Nelva Nay on: 04/29/2018 09:52 AM   Modules accepted: Orders

## 2018-05-02 ENCOUNTER — Telehealth: Payer: Self-pay | Admitting: *Deleted

## 2018-05-02 ENCOUNTER — Encounter: Payer: Self-pay | Admitting: *Deleted

## 2018-05-02 DIAGNOSIS — Z780 Asymptomatic menopausal state: Secondary | ICD-10-CM

## 2018-05-02 NOTE — Telephone Encounter (Signed)
Sharrie Rothman sent me a staff message asking to schedule dexa at [facility] due to patient BMI. Order placed at [facility] [place] they will call to schedule.

## 2018-05-03 LAB — PAP IG W/ RFLX HPV ASCU

## 2018-05-04 NOTE — Telephone Encounter (Signed)
Patient scheduled on 05/26/18

## 2018-05-26 ENCOUNTER — Encounter: Payer: Self-pay | Admitting: Gynecology

## 2018-05-26 ENCOUNTER — Other Ambulatory Visit: Payer: Self-pay | Admitting: Gynecology

## 2018-05-26 ENCOUNTER — Ambulatory Visit (INDEPENDENT_AMBULATORY_CARE_PROVIDER_SITE_OTHER): Payer: Medicare HMO

## 2018-05-26 DIAGNOSIS — Z780 Asymptomatic menopausal state: Secondary | ICD-10-CM

## 2018-09-30 ENCOUNTER — Telehealth: Payer: Self-pay | Admitting: Oncology

## 2018-09-30 NOTE — Telephone Encounter (Signed)
Rescheduled appt per MD scheduling log.  Patient aware of new appt date and time.

## 2018-10-07 ENCOUNTER — Other Ambulatory Visit: Payer: Medicare HMO

## 2018-10-07 ENCOUNTER — Ambulatory Visit: Payer: Medicare HMO | Admitting: Oncology

## 2018-12-08 ENCOUNTER — Inpatient Hospital Stay: Payer: Medicare HMO | Attending: Oncology

## 2018-12-08 ENCOUNTER — Inpatient Hospital Stay (HOSPITAL_BASED_OUTPATIENT_CLINIC_OR_DEPARTMENT_OTHER): Payer: Medicare HMO | Admitting: Oncology

## 2018-12-08 ENCOUNTER — Telehealth: Payer: Self-pay | Admitting: Oncology

## 2018-12-08 ENCOUNTER — Ambulatory Visit (HOSPITAL_COMMUNITY)
Admission: RE | Admit: 2018-12-08 | Discharge: 2018-12-08 | Disposition: A | Discharge status: Home / Self Care | Payer: Medicare HMO | Source: Ambulatory Visit | Attending: Oncology | Admitting: Oncology

## 2018-12-08 ENCOUNTER — Other Ambulatory Visit: Payer: Self-pay

## 2018-12-08 ENCOUNTER — Telehealth: Payer: Self-pay | Admitting: *Deleted

## 2018-12-08 VITALS — BP 159/76 | HR 71 | Temp 98.2°F | Resp 18 | Ht 66.0 in | Wt 350.9 lb

## 2018-12-08 DIAGNOSIS — C187 Malignant neoplasm of sigmoid colon: Secondary | ICD-10-CM | POA: Diagnosis present

## 2018-12-08 DIAGNOSIS — Z85038 Personal history of other malignant neoplasm of large intestine: Secondary | ICD-10-CM | POA: Diagnosis present

## 2018-12-08 DIAGNOSIS — D649 Anemia, unspecified: Secondary | ICD-10-CM

## 2018-12-08 DIAGNOSIS — R0989 Other specified symptoms and signs involving the circulatory and respiratory systems: Secondary | ICD-10-CM | POA: Diagnosis not present

## 2018-12-08 DIAGNOSIS — Z85528 Personal history of other malignant neoplasm of kidney: Secondary | ICD-10-CM | POA: Diagnosis not present

## 2018-12-08 DIAGNOSIS — M79604 Pain in right leg: Secondary | ICD-10-CM | POA: Diagnosis not present

## 2018-12-08 LAB — CEA (IN HOUSE-CHCC): CEA (CHCC-In House): 1.21 ng/mL (ref 0.00–5.00)

## 2018-12-08 NOTE — Progress Notes (Signed)
  [facility] OFFICE PROGRESS NOTE   Diagnosis: Colon cancer, renal cell carcinoma  INTERVAL HISTORY:   Crystal York returns for a scheduled visit.  She reports discomfort in the right popliteal area for the past 3 weeks.  No known trauma or immobility.  She was seen at the pain clinic last week and says they recommended she be evaluated for a DVT. She has chronic back pain.  She complains of intermittent "choking "with certain foods.  No difficulty with bowel function.  She is due for colonoscopy this year.  Objective:  Vital signs in last 24 hours:  Blood pressure (!) 159/76, pulse 71, temperature 98.2 F (36.8 C), resp. rate 18, height 5\' 6"  (1.676 m), weight (!) 350 lb 14.4 oz (159.2 kg), last menstrual period 10/01/2010, SpO2 99 %.   Limited physical examination secondary to distancing with the COVID pandemic HEENT: Neck without mass Lymphatics: No cervical, supraclavicular, axillary, or inguinal nodes GI: Nontender, no hepatosplenomegaly Vascular: Chronic stasis change of the lower leg bilaterally, very slight enlargement of the right compared to the left lower leg.  Mild tenderness at the right calf.  No palpable cord.     Lab Results:    Lab Results  Component Value Date   CEA1 <1.00 10/01/2017     Medications: I have reviewed the patient's current medications.   Assessment/Plan: 1. Stage IIIC (T4 ,N2) colon cancer, status post partial colectomy 10/04/2006, followed by 12 cycles of adjuvant chemotherapy. Restaging CT evaluation 07/31/2009 showed no evidence of recurrent colon cancer. Restaging CT evaluation 08/01/2010 showed no evidence of recurrent colon cancer.  CT abdomen/pelvis-renal protocol 10/05/2014-negative for recurrent cancer. 2. Status post colonoscopy by Dr. Benson Norway 10/02/2008 with findings of a sessile polyp in the ascending colon and small hemorrhoids. The pathology from the polyp confirmed a tubular adenoma. Negative colonoscopy 11/03/2013   3. History of anemia. 4. Enlarged prevascular lymph node on CT 11/03/2006, stable on CT scans April 2009, March 2010 and March 2011. 5. History of renal insufficiency. 6. History of an elevated preoperative CEA. CEA was normal on 10/01/2017 7. 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  8. "Cystic" lesion of the subcutaneous tissue at the right lumbar level. A cyst has been present in this area dating back to CT scans from 2008. This is felt to likely be a benign finding. 9. Chronic back pain-followed by a pain clinic    Disposition: [name] remains in clinical remission from colon cancer and renal cell carcinoma.  We will follow-up on the CEA from today.  She would like to continue follow-up at the Cancer center.  She will return for an office visit in 1 year. She will schedule a colonoscopy with Dr. Benson Norway.  She also plans to ask Dr. Yancey Flemings to evaluate the dysphasia.  She will schedule a follow-up with urology for surveillance of the renal cell carcinoma.  The mild discomfort at the right lower leg is likely related to a benign musculoskeletal condition.  She will be referred for a Doppler of the right leg to rule out a deep vein thrombosis.  Betsy Coder, MD  12/08/2018  12:01 PM

## 2018-12-08 NOTE — Progress Notes (Signed)
@   1315: Call report from vascular lab: negative for DVT or Bakers cyst.

## 2018-12-08 NOTE — Telephone Encounter (Signed)
Left VM with CEA result and that she was negative for DVT or Bakers cyst. Follow up with PCP if pain persists.

## 2018-12-08 NOTE — Telephone Encounter (Signed)
Scheduled per los. Patient declined printout  

## 2018-12-08 NOTE — Telephone Encounter (Signed)
-----   Message from Ladell Pier, MD sent at 12/08/2018  1:35 PM EDT ----- Please call patient, CEA is normal, follow-up as scheduled

## 2018-12-08 NOTE — Progress Notes (Signed)
Right lower extremity venous duplex has been completed. Preliminary results can be found in CV Proc through chart review.  Results were given to Manuela Schwartz at Dr. Gearldine Shown office.  12/08/18 1:07 PM Crystal York RVT

## 2019-02-22 ENCOUNTER — Encounter: Payer: Self-pay | Admitting: Gynecology

## 2019-03-01 ENCOUNTER — Other Ambulatory Visit: Payer: Self-pay | Admitting: Gastroenterology

## 2019-03-21 ENCOUNTER — Other Ambulatory Visit (HOSPITAL_COMMUNITY)
Admission: RE | Admit: 2019-03-21 | Discharge: 2019-03-21 | Disposition: A | Discharge status: Home / Self Care | Payer: Medicare HMO | Source: Ambulatory Visit | Attending: Gastroenterology | Admitting: Gastroenterology

## 2019-03-21 DIAGNOSIS — Z01812 Encounter for preprocedural laboratory examination: Secondary | ICD-10-CM | POA: Insufficient documentation

## 2019-03-21 DIAGNOSIS — Z20828 Contact with and (suspected) exposure to other viral communicable diseases: Secondary | ICD-10-CM | POA: Diagnosis not present

## 2019-03-22 LAB — NOVEL CORONAVIRUS, NAA (HOSP ORDER, SEND-OUT TO REF LAB; TAT 18-24 HRS): SARS-CoV-2, NAA: NOT DETECTED

## 2019-03-23 ENCOUNTER — Other Ambulatory Visit: Payer: Self-pay

## 2019-03-23 ENCOUNTER — Encounter (HOSPITAL_COMMUNITY): Payer: Self-pay | Admitting: *Deleted

## 2019-03-23 NOTE — Progress Notes (Signed)
Attempted pre op call for procedure 03/24/19, no answer and automated voicemail, no message left.

## 2019-03-24 ENCOUNTER — Ambulatory Visit (HOSPITAL_COMMUNITY): Payer: Medicare HMO | Admitting: Anesthesiology

## 2019-03-24 ENCOUNTER — Other Ambulatory Visit: Payer: Self-pay

## 2019-03-24 ENCOUNTER — Encounter (HOSPITAL_COMMUNITY)
Admission: RE | Disposition: A | Discharge status: Home / Self Care | Payer: Self-pay | Source: Home / Self Care | Attending: Gastroenterology

## 2019-03-24 ENCOUNTER — Encounter (HOSPITAL_COMMUNITY): Payer: Self-pay | Admitting: *Deleted

## 2019-03-24 ENCOUNTER — Ambulatory Visit (HOSPITAL_COMMUNITY)
Admission: RE | Admit: 2019-03-24 | Discharge: 2019-03-24 | Disposition: A | Discharge status: Home / Self Care | Payer: Medicare HMO | Attending: Gastroenterology | Admitting: Gastroenterology

## 2019-03-24 DIAGNOSIS — D122 Benign neoplasm of ascending colon: Secondary | ICD-10-CM | POA: Insufficient documentation

## 2019-03-24 DIAGNOSIS — Z9221 Personal history of antineoplastic chemotherapy: Secondary | ICD-10-CM | POA: Insufficient documentation

## 2019-03-24 DIAGNOSIS — Z86010 Personal history of colonic polyps: Secondary | ICD-10-CM | POA: Insufficient documentation

## 2019-03-24 DIAGNOSIS — Z85528 Personal history of other malignant neoplasm of kidney: Secondary | ICD-10-CM | POA: Diagnosis not present

## 2019-03-24 DIAGNOSIS — Z905 Acquired absence of kidney: Secondary | ICD-10-CM | POA: Insufficient documentation

## 2019-03-24 DIAGNOSIS — Z85038 Personal history of other malignant neoplasm of large intestine: Secondary | ICD-10-CM | POA: Diagnosis not present

## 2019-03-24 DIAGNOSIS — K5730 Diverticulosis of large intestine without perforation or abscess without bleeding: Secondary | ICD-10-CM | POA: Diagnosis not present

## 2019-03-24 DIAGNOSIS — Z87891 Personal history of nicotine dependence: Secondary | ICD-10-CM | POA: Insufficient documentation

## 2019-03-24 DIAGNOSIS — Y832 Surgical operation with anastomosis, bypass or graft as the cause of abnormal reaction of the patient, or of later complication, without mention of misadventure at the time of the procedure: Secondary | ICD-10-CM | POA: Insufficient documentation

## 2019-03-24 DIAGNOSIS — Z9884 Bariatric surgery status: Secondary | ICD-10-CM | POA: Diagnosis not present

## 2019-03-24 DIAGNOSIS — K9189 Other postprocedural complications and disorders of digestive system: Secondary | ICD-10-CM | POA: Insufficient documentation

## 2019-03-24 DIAGNOSIS — Z6841 Body Mass Index (BMI) 40.0 and over, adult: Secondary | ICD-10-CM | POA: Insufficient documentation

## 2019-03-24 DIAGNOSIS — K222 Esophageal obstruction: Secondary | ICD-10-CM | POA: Diagnosis not present

## 2019-03-24 DIAGNOSIS — R1310 Dysphagia, unspecified: Secondary | ICD-10-CM | POA: Insufficient documentation

## 2019-03-24 DIAGNOSIS — D123 Benign neoplasm of transverse colon: Secondary | ICD-10-CM | POA: Diagnosis not present

## 2019-03-24 DIAGNOSIS — Z1211 Encounter for screening for malignant neoplasm of colon: Secondary | ICD-10-CM | POA: Insufficient documentation

## 2019-03-24 HISTORY — PX: POLYPECTOMY: SHX5525

## 2019-03-24 HISTORY — PX: COLONOSCOPY WITH PROPOFOL: SHX5780

## 2019-03-24 HISTORY — PX: ESOPHAGOGASTRODUODENOSCOPY (EGD) WITH PROPOFOL: SHX5813

## 2019-03-24 HISTORY — PX: BALLOON DILATION: SHX5330

## 2019-03-24 SURGERY — ESOPHAGOGASTRODUODENOSCOPY (EGD) WITH PROPOFOL
Anesthesia: Monitor Anesthesia Care

## 2019-03-24 MED ORDER — PROPOFOL 10 MG/ML IV BOLUS
INTRAVENOUS | Status: AC
  Filled 2019-03-24: qty 20

## 2019-03-24 MED ORDER — SODIUM CHLORIDE 0.9 % IV SOLN: INTRAVENOUS | Status: DC

## 2019-03-24 MED ORDER — LIDOCAINE HCL 1 % IJ SOLN
INTRAMUSCULAR | Status: DC | PRN
  Administered 2019-03-24: 50 mg via INTRADERMAL

## 2019-03-24 MED ORDER — PROPOFOL 500 MG/50ML IV EMUL
INTRAVENOUS | Status: DC | PRN
  Administered 2019-03-24: 50 ug/kg/min via INTRAVENOUS

## 2019-03-24 MED ORDER — PROPOFOL 10 MG/ML IV BOLUS
INTRAVENOUS | Status: DC | PRN
  Administered 2019-03-24: 20 mg via INTRAVENOUS
  Administered 2019-03-24 (×2): 10 mg via INTRAVENOUS
  Administered 2019-03-24: 20 mg via INTRAVENOUS

## 2019-03-24 MED ORDER — PROPOFOL 500 MG/50ML IV EMUL
INTRAVENOUS | Status: AC
  Filled 2019-03-24: qty 50

## 2019-03-24 MED ORDER — LACTATED RINGERS IV SOLN
INTRAVENOUS | Status: DC
  Administered 2019-03-24: 07:00:00 100 mL via INTRAVENOUS

## 2019-03-24 SURGICAL SUPPLY — 25 items

## 2019-03-24 NOTE — Op Note (Signed)
[facility] Patient Name: Crystal York Procedure Date: 03/24/2019 MRN: [id number] Attending MD: Carol Ada , MD Date of Birth: 18-Jul-1950 CSN: [id number] Age: 68 Admit Type: Outpatient Procedure:                Colonoscopy Indications:              High risk colon cancer surveillance: Personal                            history of colon cancer Providers:                Carol Ada, MD, B[facility], RN, Lazaro Arms,                            Technician Referring MD:              Medicines:                Propofol per Anesthesia Complications:            No immediate complications. Estimated Blood Loss:     Estimated blood loss: none. Procedure:                Pre-Anesthesia Assessment:                           - Prior to the procedure, a History and Physical                            was performed, and patient medications and                            allergies were reviewed. The patient's tolerance of                            previous anesthesia was also reviewed. The risks                            and benefits of the procedure and the sedation                            options and risks were discussed with the patient.                            All questions were answered, and informed consent                            was obtained. Prior Anticoagulants: The patient has                            taken no previous anticoagulant or antiplatelet                            agents. ASA Grade Assessment: III - A patient with                            severe systemic  disease. After reviewing the risks                            and benefits, the patient was deemed in                            satisfactory condition to undergo the procedure.                           - Sedation was administered by an anesthesia                            professional. Deep sedation was attained.                           After obtaining informed consent, the colonoscope                             was passed under direct vision. Throughout the                            procedure, the patient's blood pressure, pulse, and                            oxygen saturations were monitored continuously. The                            [id number] Olympus pediatric colonscope                            was introduced through the anus and advanced to the                            the cecum, identified by appendiceal orifice and                            ileocecal valve. The colonoscopy was performed                            without difficulty. The patient tolerated the                            procedure well. The quality of the bowel                            preparation was good. The ileocecal valve,                            appendiceal orifice, and rectum were photographed. Scope In: 8:00:08 AM Scope Out: 8:14:42 AM Scope Withdrawal Time: 0 hours 7 minutes 45 seconds  Total Procedure Duration: 0 hours 14 minutes 34 seconds  Findings:      Two sessile polyps were found in the transverse colon and ascending       colon. The polyps were 3 to 4 mm in size.  These polyps were removed with       a cold snare. Resection and retrieval were complete.      Scattered small and large-mouthed diverticula were found in the sigmoid       colon.      There was evidence of a prior end-to-end colo-colonic anastomosis in the       transverse colon. This was non-patent and was characterized by healthy       appearing mucosa. The anastomosis was traversed. Impression:               - Two 3 to 4 mm polyps in the transverse colon and                            in the ascending colon, removed with a cold snare.                            Resected and retrieved.                           - Diverticulosis in the sigmoid colon.                           - Non-patent end-to-end colo-colonic anastomosis,                            characterized by healthy appearing  mucosa. Moderate Sedation:      Not Applicable - Patient had care per Anesthesia. Recommendation:           - Patient has a contact number available for                            emergencies. The signs and symptoms of potential                            delayed complications were discussed with the                            patient. Return to normal activities tomorrow.                            Written discharge instructions were provided to the                            patient.                           - Resume previous diet.                           - Continue present medications.                           - Await pathology results.                           - Repeat colonoscopy in 5 years for surveillance. Procedure Code(s):        ---  Professional ---                           [phone], Colonoscopy, flexible; with removal of                            tumor(s), polyp(s), or other lesion(s) by snare                            technique Diagnosis Code(s):        --- Professional ---                           K63.5, Polyp of colon                           Z85.038, Personal history of other malignant                            neoplasm of large intestine                           K91.89, Other postprocedural complications and                            disorders of digestive system                           K57.30, Diverticulosis of large intestine without                            perforation or abscess without bleeding CPT copyright 2019 American [employer] Association. All rights reserved. The codes documented in this report are preliminary and upon coder review may  be revised to meet current compliance requirements. Carol Ada, MD Carol Ada, MD 03/24/2019 8:54:27 AM This report has been signed electronically. Number of Addenda: 0

## 2019-03-24 NOTE — H&P (Signed)
Crystal York HPI: She was diagnosed with a transverse colon adenocarcinoma 08/2006 s/p resection. Her last colonoscopy on 11/03/2013 was normal and denies any issues with her colon. The patient does report having problems with dysphagia. It is located to teh left of the sternal notch region and cornbread will consistent cause her to have problems. Saliva and water can also cause her to have problems. She denies any problems with vomiting or hematemesis.   Past Medical History:  Diagnosis Date  . Anemia   . ASCUS of cervix with negative high risk HPV 04/2017  . Chronic back pain    chronic pain "deteriorating"  . Colon cancer [facility])    '08- chemotherapy after surgery  . Colon cancer ([place])   . H/O seasonal allergies   . History of kidney stones   . Renal cancer [facility])    '08 left neprectomy    Past Surgical History:  Procedure Laterality Date  . BARIATRIC SURGERY    . CARPAL TUNNEL RELEASE    . COLON SURGERY    . COLONOSCOPY WITH PROPOFOL N/A 11/03/2013   Procedure: COLONOSCOPY WITH PROPOFOL;  Surgeon: Beryle Beams, MD;  Location: [facility];  Service: Endoscopy;  Laterality: N/A;  . COLPOSCOPY    . GASTRIC BYPASS  1982  . HAND SURGERY     carpel tunnel surgery both hands  . HEMORRHOID SURGERY  09/1969  . HEMORRHOID SURGERY    . HERNIA REPAIR  09/2006  . HYSTEROSCOPY  10/2009   Endometrial polyp  . KIDNEY SURGERY    . NEPHRECTOMY    . radioactive therapy on back  2013  . RADIOLOGY WITH ANESTHESIA N/A 03/28/2015   Procedure: MRI LUMBER SPINE WITHOUT;  Surgeon: Medication Radiologist, MD;  Location: [place];  Service: Radiology;  Laterality: N/A;  . TONSILLECTOMY  09/1969  . TUBAL LIGATION      Family History  Problem Relation Age of Onset  . Diabetes Mother   . Hypertension Mother   . Colon cancer Sister 13  . Pancreatic cancer Sister 25  . Lung cancer Sister 56       smoker  . Lung cancer Other        niece; w/ metastasis; smoker  . Colon cancer Other 85       niece  w/ colon rupture - pt believes due to cancer  . Colon cancer Father 42       infection vs. colon cancer  . Heart Problems Father   . Rheum arthritis Maternal Grandmother   . Other Daughter        endometrial polyps  . Cancer Sister        NOS cancer; no further info  . Other Brother        NOS blood disorder  . Hodgkin's lymphoma Brother 76  . Cancer Brother        Lymphoma  . Lung cancer Brother   . Leukemia Brother   . Prostate cancer Other        nephew dx. late 25s  . Breast cancer Other        niece dx. 95    Social History:  reports that she quit smoking about 23 years ago. Her smoking use included cigarettes. She has a 0.38 pack-year smoking history. She has never used smokeless tobacco. She reports that she does not drink alcohol or use drugs.  Allergies:  Allergies  Allergen Reactions  . Hydrocodone Nausea And Vomiting  . Ciprofloxacin Swelling and Rash  .  Codeine Nausea And Vomiting and Rash  . Macrodantin [Nitrofurantoin] Nausea And Vomiting and Rash  . Sulfa Antibiotics Rash    Medications:  Scheduled:  Continuous: . sodium chloride    . lactated ringers 100 mL (03/24/19 0637)    No results found for this or any previous visit (from the past 24 hour(s)).   No results found.  ROS:  As stated above in the HPI otherwise negative.  Blood pressure (!) 143/74, pulse 85, temperature 99.1 F (37.3 C), temperature source Oral, resp. rate 20, height 5\' 6"  (1.676 m), weight (!) 149.7 kg, last menstrual period 10/01/2010, SpO2 97 %.    PE: Gen: NAD, Alert and Oriented HEENT:  [place]/AT, EOMI Neck: Supple, no LAD Lungs: CTA Bilaterally CV: RRR without M/G/R ABM: Soft, NTND, +BS Ext: No C/C/E  Assessment/Plan: 1) Personal history of colon cancer. 2) Dysphagia.  Plan: 1) Colonoscopy and EGD with dilation.  Crystal York D 03/24/2019, 7:44 AM

## 2019-03-24 NOTE — Anesthesia Preprocedure Evaluation (Signed)
Anesthesia Evaluation  Patient identified by MRN, date of birth, ID band Patient awake    Reviewed: Allergy & Precautions, NPO status , Patient's Chart, lab work & pertinent test results  Airway Mallampati: II  TM Distance: <3 FB Neck ROM: Full    Dental no notable dental hx.    Pulmonary neg pulmonary ROS, former smoker,    Pulmonary exam normal breath sounds clear to auscultation       Cardiovascular negative cardio ROS Normal cardiovascular exam Rhythm:Regular Rate:Normal     Neuro/Psych negative neurological ROS  negative psych ROS   GI/Hepatic negative GI ROS, Neg liver ROS,   Endo/Other  Morbid obesity  Renal/GU negative Renal ROS  negative genitourinary   Musculoskeletal negative musculoskeletal ROS (+)   Abdominal (+) + obese,   Peds negative pediatric ROS (+)  Hematology  (+) anemia ,   Anesthesia Other Findings   Reproductive/Obstetrics negative OB ROS                             Anesthesia Physical Anesthesia Plan  ASA: III  Anesthesia Plan: MAC   Post-op Pain Management:    Induction: Intravenous  PONV Risk Score and Plan:   Airway Management Planned: Simple Face Mask  Additional Equipment:   Intra-op Plan:   Post-operative Plan:   Informed Consent: I have reviewed the patients History and Physical, chart, labs and discussed the procedure including the risks, benefits and alternatives for the proposed anesthesia with the patient or authorized representative who has indicated his/her understanding and acceptance.     Dental advisory given  Plan Discussed with: CRNA and Surgeon  Anesthesia Plan Comments:         Anesthesia Quick Evaluation

## 2019-03-24 NOTE — Discharge Instructions (Signed)

## 2019-03-24 NOTE — Transfer of Care (Signed)
Immediate Anesthesia Transfer of Care Note  Patient: [name]  Procedure(s) Performed: ESOPHAGOGASTRODUODENOSCOPY (EGD) WITH PROPOFOL (N/A ) BALLOON DILATION (N/A ) COLONOSCOPY WITH PROPOFOL (N/A ) POLYPECTOMY  Patient Location: PACU and Endoscopy Unit  Anesthesia Type:MAC  Level of Consciousness: awake, alert , oriented and patient cooperative  Airway & Oxygen Therapy: Patient Spontanous Breathing and Patient connected to face mask oxygen  Post-op Assessment: Report given to RN and Post -op Vital signs reviewed and stable  Post vital signs: Reviewed and stable  Last Vitals:  Vitals Value Taken Time  BP 114/66 03/24/19 0822  Temp 36.7 C 03/24/19 0821  Pulse 62 03/24/19 0825  Resp 16 03/24/19 0825  SpO2 100 % 03/24/19 0825  Vitals shown include unvalidated device data.  Last Pain:  Vitals:   03/24/19 0821  TempSrc: Temporal  PainSc: 0-No pain         Complications: No apparent anesthesia complications

## 2019-03-24 NOTE — Op Note (Addendum)
[facility] Patient Name: Crystal York Procedure Date: 03/24/2019 MRN: [id number] Attending MD: Carol Ada , MD Date of Birth: 1950/06/20 CSN: [id number] Age: 68 Admit Type: Outpatient Procedure:                Upper GI endoscopy Indications:              Dysphagia Providers:                Carol Ada, MD, Baird Cancer, RN, Lazaro Arms,                            Technician Referring MD:              Medicines:                Propofol per Anesthesia Complications:            No immediate complications. Estimated Blood Loss:     Estimated blood loss: none. Procedure:                Pre-Anesthesia Assessment:                           - Prior to the procedure, a History and Physical                            was performed, and patient medications and                            allergies were reviewed. The patient's tolerance of                            previous anesthesia was also reviewed. The risks                            and benefits of the procedure and the sedation                            options and risks were discussed with the patient.                            All questions were answered, and informed consent                            was obtained. Prior Anticoagulants: The patient has                            taken no previous anticoagulant or antiplatelet                            agents. ASA Grade Assessment: III - A patient with                            severe systemic disease. After reviewing the risks                            and benefits,  the patient was deemed in                            satisfactory condition to undergo the procedure.                           - Sedation was administered by an anesthesia                            professional. Deep sedation was attained.                           After obtaining informed consent, the endoscope was                            passed under direct vision. Throughout the                 procedure, the patient's blood pressure, pulse, and                            oxygen saturations were monitored continuously. The                            [id number]-[id number]) Olympus gastroscope was                            introduced through the mouth, and advanced to the                            second part of duodenum. The upper GI endoscopy was                            accomplished without difficulty. The patient                            tolerated the procedure well. Scope In: Scope Out: Findings:      No endoscopic abnormality was evident in the esophagus to explain the       patient's complaint of dysphagia. It was decided, however, to proceed       with dilation of the upper third of the esophagus and of the lower third       of the esophagus. A TTS dilator was passed through the scope. Dilation       with a 15-16.5-18 mm balloon dilator was performed to 18 mm. The       dilation site was examined and showed no change. Estimated blood loss:       none.      Evidence of a previous surgical anastomosis was found in the cardia.       This was characterized by healthy appearing mucosa and mild stenosis. A       TTS dilator was passed through the scope. Dilation with a 15-16.5-18 mm       balloon dilator was performed to 18 mm. The dilation site was examined       and showed no change. Estimated blood loss: none.      The examined duodenum was  normal.      In the proximal stomach there was evidence of a prior gastric lumen       restriction. This was consistent with her reports of a bariatric surgery       40 years ago. This was a small pouch and it was also torturous. Dilation       was performed with the 18 mm balloon. Impression:               - No endoscopic esophageal abnormality to explain                            patient's dysphagia. Esophagus dilated. Dilated.                           - A previous surgical anastomosis was found,                             characterized by healthy appearing mucosa and mild                            stenosis. Dilated.                           - Normal examined duodenum.                           - No specimens collected. Moderate Sedation:      Not Applicable - Patient had care per Anesthesia. Recommendation:           - Patient has a contact number available for                            emergencies. The signs and symptoms of potential                            delayed complications were discussed with the                            patient. Return to normal activities tomorrow.                            Written discharge instructions were provided to the                            patient.                           - Resume regular diet.                           - Continue present medications.                           - Return to GI clinic in 4 weeks. Procedure Code(s):        --- Professional ---  [id number], Esophagogastroduodenoscopy, flexible,                            transoral; with dilation of gastric/duodenal                            stricture(s) (eg, balloon, bougie)                           43249, Esophagogastroduodenoscopy, flexible,                            transoral; with transendoscopic balloon dilation of                            esophagus (less than 30 mm diameter) Diagnosis Code(s):        --- Professional ---                           R13.10, Dysphagia, unspecified                           Z98.0, Intestinal bypass and anastomosis status CPT copyright 2019 [employer]. All rights reserved. The codes documented in this report are preliminary and upon coder review may  be revised to meet current compliance requirements. Carol Ada, MD Carol Ada, MD 03/24/2019 8:51:13 AM This report has been signed electronically. Number of Addenda: 0

## 2019-03-24 NOTE — Anesthesia Postprocedure Evaluation (Signed)
Anesthesia Post Note  Patient: [name]  Procedure(s) Performed: ESOPHAGOGASTRODUODENOSCOPY (EGD) WITH PROPOFOL (N/A ) BALLOON DILATION (N/A ) COLONOSCOPY WITH PROPOFOL (N/A ) POLYPECTOMY     Patient location during evaluation: PACU Anesthesia Type: MAC Level of consciousness: awake and alert Pain management: pain level controlled Vital Signs Assessment: post-procedure vital signs reviewed and stable Respiratory status: spontaneous breathing, nonlabored ventilation, respiratory function stable and patient connected to nasal cannula oxygen Cardiovascular status: stable and blood pressure returned to baseline Postop Assessment: no apparent nausea or vomiting Anesthetic complications: no    Last Vitals:  Vitals:   03/24/19 0840 03/24/19 0845  BP:  (!) 163/73  Pulse: 67 69  Resp: (!) 26 19  Temp:    SpO2: 96% 98%    Last Pain:  Vitals:   03/24/19 0845  TempSrc:   PainSc: 0-No pain                 Crystal York S

## 2019-03-27 LAB — SURGICAL PATHOLOGY

## 2019-04-11 ENCOUNTER — Other Ambulatory Visit: Payer: Self-pay | Admitting: Gynecology

## 2019-04-11 DIAGNOSIS — Z1231 Encounter for screening mammogram for malignant neoplasm of breast: Secondary | ICD-10-CM

## 2019-05-01 ENCOUNTER — Other Ambulatory Visit: Payer: Self-pay

## 2019-05-02 ENCOUNTER — Ambulatory Visit: Payer: Medicare HMO | Admitting: Gynecology

## 2019-05-02 ENCOUNTER — Encounter: Payer: Self-pay | Admitting: Gynecology

## 2019-05-02 VITALS — BP 134/84 | Ht 64.0 in | Wt 324.0 lb

## 2019-05-02 DIAGNOSIS — Z9189 Other specified personal risk factors, not elsewhere classified: Secondary | ICD-10-CM | POA: Diagnosis not present

## 2019-05-02 DIAGNOSIS — N952 Postmenopausal atrophic vaginitis: Secondary | ICD-10-CM

## 2019-05-02 DIAGNOSIS — Z01419 Encounter for gynecological examination (general) (routine) without abnormal findings: Secondary | ICD-10-CM

## 2019-05-02 NOTE — Progress Notes (Signed)
    Crystal York [birth year] [id number]        68 y.o.  G2P2 for breast and pelvic exam.  Without gynecologic complaints  Past medical history,surgical history, problem list, medications, allergies, family history and social history were all reviewed and documented as reviewed in the EPIC chart.  ROS:  Performed with pertinent positives and negatives included in the history, assessment and plan.   Additional significant findings : None   Exam: Caryn Bee assistant Vitals:   05/02/19 1158  BP: 134/84  Weight: (!) 324 lb (147 kg)  Height: 5\' 4"  (1.626 m)   Body mass index is 55.61 kg/m.  General appearance:  Normal affect, orientation and appearance. Skin: Grossly normal HEENT: Without gross lesions.  No cervical or supraclavicular adenopathy. Thyroid normal.  Lungs:  Clear without wheezing, rales or rhonchi Cardiac: RR, without RMG Abdominal:  Soft, nontender, without masses, guarding, rebound, organomegaly or hernia Breasts:  Examined lying and sitting without masses, retractions, discharge or axillary adenopathy. Pelvic:  Ext, BUS, Vagina: With atrophic changes  Cervix: With atrophic changes  Uterus: Unable to palpate.  No gross masses or tenderness adnexa: Without masses or tenderness    Anus and perineum: Normal   Rectovaginal: Normal sphincter tone without palpated masses or tenderness.    Assessment/Plan:  Crystal York for breast and pelvic exam  1. Postmenopausal.  No significant menopausal symptoms or any vaginal bleeding. 2. Mammography 2019.  Has scheduled in January.  Breast exam normal today. 3. Colonoscopy 2020.  Repeat at their recommended interval. 4. Pap smear 2019.  No Pap smear done today.  History of ASCUS negative high risk HPV 2018 with all other previous Pap smears normal.  Plan repeat Pap smear next year. 5. DEXA 2020 normal.  Plan repeat DEXA at 5-year interval. 6. Health maintenance.  No routine lab work done as patient does this elsewhere.   Follow-up 1 year, sooner as needed.   Anastasio Auerbach MD, 12:25 PM 05/02/2019

## 2019-05-02 NOTE — Patient Instructions (Signed)
Follow-up in 1 year for annual exam 

## 2019-05-10 ENCOUNTER — Other Ambulatory Visit: Payer: Self-pay | Admitting: Gastroenterology

## 2019-05-10 DIAGNOSIS — R1310 Dysphagia, unspecified: Secondary | ICD-10-CM

## 2019-05-26 ENCOUNTER — Other Ambulatory Visit: Payer: Medicare HMO

## 2019-06-02 ENCOUNTER — Ambulatory Visit
Admission: RE | Admit: 2019-06-02 | Discharge: 2019-06-02 | Disposition: A | Discharge status: Home / Self Care | Payer: Medicare HMO | Source: Ambulatory Visit | Attending: Gastroenterology | Admitting: Gastroenterology

## 2019-06-02 DIAGNOSIS — R1310 Dysphagia, unspecified: Secondary | ICD-10-CM

## 2019-06-06 ENCOUNTER — Ambulatory Visit
Admission: RE | Admit: 2019-06-06 | Discharge: 2019-06-06 | Disposition: A | Discharge status: Home / Self Care | Payer: Medicare HMO | Source: Ambulatory Visit | Attending: Gynecology | Admitting: Gynecology

## 2019-06-06 ENCOUNTER — Other Ambulatory Visit: Payer: Self-pay

## 2019-06-06 ENCOUNTER — Other Ambulatory Visit: Payer: Self-pay | Admitting: Women's Health

## 2019-06-06 DIAGNOSIS — Z1231 Encounter for screening mammogram for malignant neoplasm of breast: Secondary | ICD-10-CM

## 2019-12-08 ENCOUNTER — Inpatient Hospital Stay: Payer: Medicare HMO | Attending: Oncology | Admitting: Oncology

## 2019-12-08 ENCOUNTER — Other Ambulatory Visit: Payer: Self-pay

## 2019-12-08 ENCOUNTER — Inpatient Hospital Stay: Payer: Medicare HMO

## 2019-12-08 ENCOUNTER — Telehealth: Payer: Self-pay

## 2019-12-08 ENCOUNTER — Telehealth: Payer: Self-pay | Admitting: Oncology

## 2019-12-08 VITALS — BP 141/75 | HR 78 | Temp 97.7°F | Resp 19 | Ht 64.0 in | Wt 281.3 lb

## 2019-12-08 DIAGNOSIS — C187 Malignant neoplasm of sigmoid colon: Secondary | ICD-10-CM

## 2019-12-08 DIAGNOSIS — Z85038 Personal history of other malignant neoplasm of large intestine: Secondary | ICD-10-CM | POA: Insufficient documentation

## 2019-12-08 DIAGNOSIS — Z86010 Personal history of colonic polyps: Secondary | ICD-10-CM | POA: Diagnosis not present

## 2019-12-08 DIAGNOSIS — Z85528 Personal history of other malignant neoplasm of kidney: Secondary | ICD-10-CM | POA: Insufficient documentation

## 2019-12-08 LAB — CEA (IN HOUSE-CHCC): CEA (CHCC-In House): 1.16 ng/mL (ref 0.00–5.00)

## 2019-12-08 NOTE — Telephone Encounter (Signed)
-----   Message from Ladell Pier, MD sent at 12/08/2019  1:31 PM EDT ----- Please call patient, the CEA is normal, follow-up as scheduled

## 2019-12-08 NOTE — Progress Notes (Signed)
  Crystal York OFFICE PROGRESS NOTE   Diagnosis: Colon cancer  INTERVAL HISTORY:   Crystal York returns as scheduled.  She feels well.  She reports intentional weight loss with a diet program she started in April.  No difficulty with bowel function.  No bleeding.  Objective:  Vital signs in last 24 hours:  Blood pressure (!) 141/75, pulse 78, temperature 97.7 F (36.5 C), temperature source Temporal, resp. rate 19, height 5\' 4"  (1.626 m), weight (!) 281 lb 4.8 oz (127.6 kg), last menstrual period 10/01/2010, SpO2 99 %.    HEENT: Neck without mass Lymphatics: No cervical, supraclavicular, axillary, or inguinal nodes Resp: Lungs clear bilaterally Cardio: Regular rate and rhythm GI: No hepatosplenomegaly, no mass, nontender Vascular: Trace edema with chronic stasis change at the lower leg bilaterally    Lab Results:  Lab Results  Component Value Date   WBC 8.9 03/26/2015   HGB 12.5 03/26/2015   HCT 37.6 03/26/2015   MCV 86.0 03/26/2015   PLT 167 03/26/2015   NEUTROABS 9.1 (H) 04/26/2012    CMP  Lab Results  Component Value Date   NA 142 10/01/2017   K 4.3 10/01/2017   CL 108 10/01/2017   CO2 29 10/01/2017   GLUCOSE 94 10/01/2017   BUN 12 10/01/2017   CREATININE 1.24 (H) 10/01/2017   CALCIUM 9.1 10/01/2017   PROT 7.0 04/26/2012   ALBUMIN 3.8 04/26/2012   AST 14 04/26/2012   ALT 17 04/26/2012   ALKPHOS 62 04/26/2012   BILITOT 0.4 04/26/2012   GFRNONAA 44 (L) 10/01/2017   GFRAA 51 (L) 10/01/2017    Lab Results  Component Value Date   CEA1 1.16 12/08/2019    Medications: I have reviewed the patient's current medications.   Assessment/Plan: 1. Stage IIIC (T4 ,N2) colon cancer, status post partial colectomy 10/04/2006, followed by 12 cycles of adjuvant chemotherapy. Restaging CT evaluation 07/31/2009 showed no evidence of recurrent colon cancer. Restaging CT evaluation 08/01/2010 showed no evidence of recurrent colon cancer.  CT  abdomen/pelvis-renal protocol 10/05/2014-negative for recurrent cancer. 2. Status post colonoscopy by Dr. Benson Norway 10/02/2008 with findings of a sessile polyp in the ascending colon and small hemorrhoids. The pathology from the polyp confirmed a tubular adenoma. Negative colonoscopy 11/03/2013  3. Colonoscopy 03/24/2019-polyps removed from the transverse and ascending colon, tubular adenomas 4. History of anemia. 5. Enlarged prevascular lymph node on CT 11/03/2006, stable on CT scans April 2009, March 2010 and March 2011. 6. History of renal insufficiency. 7. History of an elevated preoperative CEA. CEA was normal on 10/01/2017 8. 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  9. "Cystic" lesion of the subcutaneous tissue at the right lumbar level. A cyst has been present in this area dating back to CT scans from 2008. This is felt to likely be a benign finding. 10. Chronic back pain-followed by a pain clinic      Disposition: Crystal York is in remission from colon cancer and renal cell carcinoma.  She would like to continue follow-up at the Cancer center.  She will return for an office visit and CEA in 1 year.  Crystal York will continue colonoscopy surveillance with Dr. Benson Norway.  She declines the COVID-19 vaccine.  Betsy Coder, MD  12/08/2019  12:50 PM

## 2019-12-08 NOTE — Telephone Encounter (Signed)
Left message no new orders lab results and follow up as scheduled

## 2019-12-08 NOTE — Telephone Encounter (Signed)
Scheduled per 7/23 los. Printed avs and calendar for pt.  

## 2020-02-19 DIAGNOSIS — L821 Other seborrheic keratosis: Secondary | ICD-10-CM | POA: Insufficient documentation

## 2020-05-30 ENCOUNTER — Ambulatory Visit: Payer: Medicare HMO | Admitting: Obstetrics and Gynecology

## 2020-05-30 ENCOUNTER — Encounter: Payer: Self-pay | Admitting: Obstetrics and Gynecology

## 2020-05-30 ENCOUNTER — Other Ambulatory Visit: Payer: Self-pay

## 2020-05-30 VITALS — BP 138/80 | Ht 64.0 in | Wt 258.0 lb

## 2020-05-30 DIAGNOSIS — Z01419 Encounter for gynecological examination (general) (routine) without abnormal findings: Secondary | ICD-10-CM | POA: Diagnosis not present

## 2020-05-30 NOTE — Progress Notes (Signed)
   Crystal York [facility] [birth year] [id number]  SUBJECTIVE:  70 y.o. G2P2 female here for a breast and pelvic exam. She has no gynecologic concerns.  Current Outpatient Medications  Medication Sig Dispense Refill  . Camphor-Menthol-Methyl Sal (SALONPAS TD) Place 1 patch onto the skin daily. With Lidocaine    . diazepam (VALIUM) 5 MG tablet Take 5 mg by mouth daily as needed (vertigo). Reported on 09/17/2015    . diphenhydrAMINE (BENADRYL) 25 MG tablet Take 25 mg by mouth daily as needed for allergies.    . Multiple Vitamin (MULTIVITAMIN) tablet Take 1 tablet by mouth daily. Diet plan vitamin     No current facility-administered medications for this visit.   Allergies: Hydrocodone, Ciprofloxacin, Codeine, Macrodantin [nitrofurantoin], and Sulfa antibiotics  Patient's last menstrual period was 10/01/2010.  Past medical history,surgical history, problem list, medications, allergies, family history and social history were all reviewed and documented as reviewed in the EPIC chart.  GYN ROS: no abnormal bleeding, pelvic pain or discharge, no breast pain or new or enlarging lumps on self exam.  No dysuria, urinary frequency, pain with urination, cloudy/malodorous urine.   OBJECTIVE:  BP 138/80 (BP Location: Right Arm, Patient Position: Sitting, Cuff Size: Large)   Ht 5\' 4"  (1.626 m)   Wt 258 lb (117 kg)   LMP 10/01/2010   BMI 44.29 kg/m  The patient appears well, alert, oriented, in no distress.  BREAST EXAM: breasts appear normal, no suspicious masses, no skin or nipple changes or axillary nodes  PELVIC EXAM: VULVA: normal appearing vulva with atrophic change, no masses, tenderness or lesions, VAGINA: normal appearing vagina with atrophic change, normal color and discharge, no lesions, CERVIX: normal appearing atrophic cervix without discharge or lesions, UTERUS: uterus is normal size, shape, consistency and nontender, ADNEXA: normal adnexa in size, nontender and no masses Chaperone: Wandra Scot  Bonham present during the examination  ASSESSMENT:  70 y.o. G2P2 here for a breast and pelvic exam  PLAN:   1. Postmenopausal.  No significant hot flashes or night sweats.  No vaginal bleeding. 2. Pap smear 04/2018.  ASCUS negative high-risk HPV 2018, otherwise all previous Pap smears were normal.  Will address if she desires to continue screening, if so, Pap smear due at next annual visit. 3. Mammogram 05/2019.  Normal breast exam today.  She is reminded to schedule an annual mammogram this year and she agrees to do this. 4. Colonoscopy 2020.  She will follow up at the interval recommended by her GI specialist.   5. DEXA 2020 normal.  Next DEXA recommended at 5 year interval. 6. Health maintenance.  No labs today as she normally has these completed elsewhere.  Says her blood pressure typically measures much lower at home and will keep an eye on it and notify her primary doctor of any persistently elevated values >130/>80.  I congratulated on her excellent weight loss and she is feeling much better being down about 70 lbs from her last visit here.  Return annually or sooner, prn.  Joseph Pierini MD 05/30/20

## 2020-09-05 DIAGNOSIS — D649 Anemia, unspecified: Secondary | ICD-10-CM | POA: Insufficient documentation

## 2020-09-05 DIAGNOSIS — N1831 Chronic kidney disease, stage 3a: Secondary | ICD-10-CM | POA: Insufficient documentation

## 2020-11-22 ENCOUNTER — Telehealth: Payer: Self-pay | Admitting: Oncology

## 2020-11-22 NOTE — Telephone Encounter (Signed)
I called and left a message for patient regarding her 7/22 appointments that are needing to be rescheduled due to change in provider's schedule.  I also left our office number in case she needed to change either date or time

## 2020-12-05 ENCOUNTER — Inpatient Hospital Stay: Payer: Medicare HMO | Admitting: Oncology

## 2020-12-05 ENCOUNTER — Other Ambulatory Visit: Payer: Self-pay

## 2020-12-05 ENCOUNTER — Inpatient Hospital Stay: Payer: Medicare HMO | Attending: Oncology

## 2020-12-05 VITALS — BP 127/56 | HR 71 | Temp 97.7°F | Resp 18 | Wt 272.2 lb

## 2020-12-05 DIAGNOSIS — Z85038 Personal history of other malignant neoplasm of large intestine: Secondary | ICD-10-CM | POA: Insufficient documentation

## 2020-12-05 DIAGNOSIS — C187 Malignant neoplasm of sigmoid colon: Secondary | ICD-10-CM

## 2020-12-05 DIAGNOSIS — Z85528 Personal history of other malignant neoplasm of kidney: Secondary | ICD-10-CM | POA: Insufficient documentation

## 2020-12-05 LAB — CEA (ACCESS): CEA (CHCC): 1.18 ng/mL (ref 0.00–5.00)

## 2020-12-05 LAB — CEA (IN HOUSE-CHCC): CEA (CHCC-In House): 1.2 ng/mL (ref 0.00–5.00)

## 2020-12-05 NOTE — Progress Notes (Signed)
[place] OFFICE PROGRESS NOTE   Diagnosis: Colon cancer  INTERVAL HISTORY:   Crystal York returns for a scheduled visit.  She feels well.  She reports intentional weight loss with a low carbohydrate diet.  She had an episode of blood on her "pad "several months ago.  This has not recurred.  She has a history of hemorrhoids.  She reports the hemoglobin returned low at Dr. Dois Davenport in April.  She has not been able to arrange a follow-up visit.  Objective:  Vital signs in last 24 hours:  Blood pressure (!) 127/56, pulse 71, temperature 97.7 F (36.5 C), temperature source Temporal, resp. rate 18, weight 272 lb 3.2 oz (123.5 kg), last menstrual period 10/01/2010, SpO2 99 %.    Lymphatics: No cervical, supraclavicular, axillary, or inguinal nodes Resp: Lungs clear bilaterally Cardio: Regular rate and rhythm GI: No hepatosplenomegaly, no mass, nontender Vascular: Chronic stasis change at the lower leg bilaterally    Lab Results:  Lab Results  Component Value Date   WBC 8.9 03/26/2015   HGB 12.5 03/26/2015   HCT 37.6 03/26/2015   MCV 86.0 03/26/2015   PLT 167 03/26/2015   NEUTROABS 9.1 (H) 04/26/2012    CMP  Lab Results  Component Value Date   NA 142 10/01/2017   K 4.3 10/01/2017   CL 108 10/01/2017   CO2 29 10/01/2017   GLUCOSE 94 10/01/2017   BUN 12 10/01/2017   CREATININE 1.24 (H) 10/01/2017   CALCIUM 9.1 10/01/2017   PROT 7.0 04/26/2012   ALBUMIN 3.8 04/26/2012   AST 14 04/26/2012   ALT 17 04/26/2012   ALKPHOS 62 04/26/2012   BILITOT 0.4 04/26/2012   GFRNONAA 44 (L) 10/01/2017   GFRAA 51 (L) 10/01/2017    Lab Results  Component Value Date   CEA1 1.16 12/08/2019   CEA <0.5 10/22/2015   CBC from [facility] 09/05/2020: Hemoglobin 12.1 (12.3-15.3), hematocrit 37.7%, MCV 89.1, platelets 147,000   Medications: I have reviewed the patient's current medications.   Assessment/Plan: Stage IIIC (T4 ,N2) colon cancer, status post partial  colectomy 10/04/2006, followed by 12 cycles of adjuvant chemotherapy. Restaging CT evaluation 07/31/2009 showed no evidence of recurrent colon cancer. Restaging CT evaluation 08/01/2010 showed no evidence of recurrent colon cancer.   CT abdomen/pelvis-renal protocol  10/05/2014-negative for recurrent cancer. Status post colonoscopy by Dr. Benson Norway 10/02/2008 with findings of a sessile polyp in the ascending colon and small hemorrhoids. The pathology from the polyp confirmed a tubular adenoma. Negative colonoscopy 11/03/2013  Colonoscopy 03/24/2019-polyps removed from the transverse and ascending colon, tubular adenomas History of anemia. Enlarged prevascular lymph node on CT 11/03/2006, stable on CT scans April 2009, March 2010 and March 2011. History of renal insufficiency. History of an elevated preoperative CEA. CEA was normal on 10/01/2017 Status post left nephrectomy for stage I renal cell carcinoma 10/04/2006.  "Cystic" lesion of the subcutaneous tissue at the right lumbar level. A cyst has been present in this area dating back to CT scans from 2008. This is felt to likely be a benign finding. Chronic back pain-followed by a pain clinic   Disposition: Crystal York remains in clinical remission from colon cancer and renal cell carcinoma.  She continues urology follow-up.  We will follow-up on the CEA from today.  She would like to continue follow-up at the Cancer center.  She will return for an office visit in 1 year.  The hemoglobin was mildly decreased on a CBC in April.  The platelet count  was just below the normal range.  I recommended she follow-up with Dr. Redmond Pulling for a repeat CBC within the next 1-2 months.  I will be glad to see her if she has a progressive hematologic abnormality.  She will call for recurrent rectal bleeding.  She last had a colonoscopy in November 2020.  She will see Dr. Benson Norway for recurrent bleeding.    Betsy Coder, MD  12/05/2020  11:35 AM

## 2020-12-06 ENCOUNTER — Ambulatory Visit: Payer: Medicare HMO | Admitting: Oncology

## 2020-12-06 ENCOUNTER — Other Ambulatory Visit: Payer: Medicare HMO

## 2020-12-06 ENCOUNTER — Telehealth: Payer: Self-pay

## 2020-12-06 NOTE — Telephone Encounter (Signed)
VM message left Per Dr Benay Spice to inform Pt of normal CEA results. Informed Pt if she has any problems, questions, or concerns to return call to clinic

## 2020-12-06 NOTE — Telephone Encounter (Signed)
-----   Message from Ladell Pier, MD sent at 12/05/2020  7:52 PM EDT ----- Please call patient, cea is normal, f/u as scheduled

## 2021-06-05 ENCOUNTER — Ambulatory Visit (INDEPENDENT_AMBULATORY_CARE_PROVIDER_SITE_OTHER): Payer: Medicare HMO | Admitting: Obstetrics & Gynecology

## 2021-06-05 ENCOUNTER — Encounter: Payer: Self-pay | Admitting: Obstetrics & Gynecology

## 2021-06-05 ENCOUNTER — Other Ambulatory Visit (HOSPITAL_COMMUNITY)
Admission: RE | Admit: 2021-06-05 | Discharge: 2021-06-05 | Disposition: A | Discharge status: Home / Self Care | Payer: Medicare HMO | Source: Ambulatory Visit | Attending: Obstetrics & Gynecology | Admitting: Obstetrics & Gynecology

## 2021-06-05 ENCOUNTER — Other Ambulatory Visit: Payer: Self-pay

## 2021-06-05 VITALS — BP 124/80 | HR 96 | Resp 16 | Ht 64.0 in | Wt 298.0 lb

## 2021-06-05 DIAGNOSIS — R87610 Atypical squamous cells of undetermined significance on cytologic smear of cervix (ASC-US): Secondary | ICD-10-CM

## 2021-06-05 DIAGNOSIS — N9089 Other specified noninflammatory disorders of vulva and perineum: Secondary | ICD-10-CM

## 2021-06-05 DIAGNOSIS — N898 Other specified noninflammatory disorders of vagina: Secondary | ICD-10-CM | POA: Diagnosis not present

## 2021-06-05 DIAGNOSIS — Z01419 Encounter for gynecological examination (general) (routine) without abnormal findings: Secondary | ICD-10-CM | POA: Diagnosis not present

## 2021-06-05 DIAGNOSIS — Z9189 Other specified personal risk factors, not elsewhere classified: Secondary | ICD-10-CM

## 2021-06-05 LAB — WET PREP FOR TRICH, YEAST, CLUE

## 2021-06-05 MED ORDER — CLOBETASOL PROPIONATE 0.05 % EX OINT: 1.0000 "application " | TOPICAL_OINTMENT | Freq: Two times a day (BID) | CUTANEOUS | 1 refills | Status: AC

## 2021-06-05 NOTE — Progress Notes (Signed)
Crystal York [facility] 24-Sep-1950 [id number]   History:    71 y.o. G2P2L2 Married  RP:  Established patient presenting for annual gyn exam   HPI: Postmenopausal, well on no HRT.  No significant hot flashes or night sweats.  No vaginal bleeding. No pelvic pain. C/O vaginal d/c and vulvar irritation.  Pap smear Neg in 04/2018.  ASCUS negative high-risk HPV 2018, otherwise all previous Pap smears were normal. Breasts normal.  Mammogram Neg in 05/2019. Colonoscopy 2020. DEXA 2020 normal.  Next DEXA recommended at 5 year interval. Health labs with Fam MD.  BMI 51.15.    Past medical history,surgical history, family history and social history were all reviewed and documented in the EPIC chart.  Gynecologic History Patient's last menstrual period was 10/01/2010.  Obstetric History OB History  Gravida Para Term Preterm AB Living  2 2       2   SAB IAB Ectopic Multiple Live Births               # Outcome Date GA Lbr Len/2nd Weight Sex Delivery Anes PTL Lv  2 Para           1 Para              ROS: A ROS was performed and pertinent positives and negatives are included in the history.  GENERAL: No fevers or chills. HEENT: No change in vision, no earache, sore throat or sinus congestion. NECK: No pain or stiffness. CARDIOVASCULAR: No chest pain or pressure. No palpitations. PULMONARY: No shortness of breath, cough or wheeze. GASTROINTESTINAL: No abdominal pain, nausea, vomiting or diarrhea, melena or bright red blood per rectum. GENITOURINARY: No urinary frequency, urgency, hesitancy or dysuria. MUSCULOSKELETAL: No joint or muscle pain, no back pain, no recent trauma. DERMATOLOGIC: No rash, no itching, no lesions. ENDOCRINE: No polyuria, polydipsia, no heat or cold intolerance. No recent change in weight. HEMATOLOGICAL: No anemia or easy bruising or bleeding. NEUROLOGIC: No headache, seizures, numbness, tingling or weakness. PSYCHIATRIC: No depression, no loss of interest in normal activity or change in  sleep pattern.     Exam:   BP 124/80    Pulse 96    Resp 16    Ht 5\' 4"  (1.626 m)    Wt 298 lb (135.2 kg)    LMP 10/01/2010    BMI 51.15 kg/m   Body mass index is 51.15 kg/m.  General appearance : Well developed well nourished female. No acute distress HEENT: Eyes: no retinal hemorrhage or exudates,  Neck supple, trachea midline, no carotid bruits, no thyroidmegaly Lungs: Clear to auscultation, no rhonchi or wheezes, or rib retractions  Heart: Regular rate and rhythm, no murmurs or gallops Breast:Examined in sitting and supine position were symmetrical in appearance, no palpable masses or tenderness,  no skin retraction, no nipple inversion, no nipple discharge, no skin discoloration, no axillary or supraclavicular lymphadenopathy Abdomen: no palpable masses or tenderness, no rebound or guarding Extremities: no edema or skin discoloration or tenderness  Pelvic: Vulva: Erythema, inflammation             Vagina: No gross lesions.  Increased discharge.  Wet prep done.  Cervix: No gross lesions or discharge.  Pap reflex done.  Uterus  AV, normal size, shape and consistency, non-tender and mobile  Adnexa  Without masses or tenderness  Anus: Normal  Wet Prep:  No yeast, no Clue cells.  Moderate WBCs.   Assessment/Plan:  71 y.o. female for annual exam   1. Encounter  for routine gynecological examination with Papanicolaou smear of cervix Postmenopausal, well on no HRT.  No significant hot flashes or night sweats.  No vaginal bleeding. No pelvic pain. C/O vaginal d/c and vulvar irritation.  Pap smear Neg in 04/2018.  ASCUS negative high-risk HPV 2018, otherwise all previous Pap smears were normal. Breasts normal.  Mammogram Neg in 05/2019. Colonoscopy 2020. DEXA 2020 normal.  Next DEXA recommended at 5 year interval. Health labs with Fam MD.  BMI 51.15.   - Cytology - PAP( [facility])  2. Atypical squamous cell changes of undetermined significance (ASCUS) on cervical cytology with negative  high risk human papilloma virus (HPV) test result - Cytology - PAP( [facility])  3. At risk for infection  4. Vaginal discharge Wet prep Neg, reassured. - WET PREP FOR [place], YEAST, CLUE  5. Vulvar irritation Mild vulvitis.  Will treat with Clobetasol ointment.  Usage reviewed, prescription sent to pharmacy. - WET PREP FOR [place], YEAST, CLUE  Other orders - DUREZOL 0.05 % EMUL; Apply 1 drop to eye 4 (four) times daily. (Patient not taking: Reported on 06/05/2021) - prednisoLONE acetate (PRED FORTE) 1 % ophthalmic suspension; SMARTSIG:1 Drop(s) In Eye(s) 5 Times Daily  - clobetasol ointment (TEMOVATE) 0.05 %; Apply 1 application topically 2 (two) times daily for 14 days. Thin application on affected vulva.  Start weaning progressively after 2 weeks.   Princess Bruins MD, 11:38 AM 06/05/2021

## 2021-06-06 LAB — CYTOLOGY - PAP: Diagnosis: NEGATIVE

## 2021-06-13 ENCOUNTER — Encounter: Payer: Self-pay | Admitting: Obstetrics & Gynecology

## 2021-06-18 ENCOUNTER — Other Ambulatory Visit: Payer: Self-pay

## 2021-06-18 ENCOUNTER — Ambulatory Visit: Payer: Medicare HMO | Admitting: Urology

## 2021-06-18 ENCOUNTER — Encounter: Payer: Self-pay | Admitting: Urology

## 2021-06-18 VITALS — BP 138/75 | HR 86

## 2021-06-18 DIAGNOSIS — R351 Nocturia: Secondary | ICD-10-CM | POA: Diagnosis not present

## 2021-06-18 DIAGNOSIS — N3020 Other chronic cystitis without hematuria: Secondary | ICD-10-CM

## 2021-06-18 LAB — MICROSCOPIC EXAMINATION
Epithelial Cells (non renal): >10 /hpf — AB (ref 0–10)
Renal Epithel, UA: NONE SEEN /hpf

## 2021-06-18 LAB — URINALYSIS, ROUTINE W REFLEX MICROSCOPIC
Bilirubin, UA: NEGATIVE
Glucose, UA: NEGATIVE
Ketones, UA: NEGATIVE
Nitrite, UA: NEGATIVE
Protein,UA: NEGATIVE
Specific Gravity, UA: 1.025 (ref 1.005–1.030)
Urobilinogen, Ur: 0.2 mg/dL (ref 0.2–1.0)
pH, UA: 5.5 (ref 5.0–7.5)

## 2021-06-18 NOTE — Progress Notes (Signed)
06/18/2021 2:35 PM   Crystal York 06/06/1950 [id number]  Referring provider: No referring provider defined for this encounter.  No chief complaint on file.   HPI: Crystal York is a 71yo here for evaluation of recurrent UTI and nocturia. She has had 2 UTIs per year in the past 3 years. She is starting the [employer]. Nocturia decreased to 1-2x since last visit. No issues urinating. No hematuria. Renal US 2017 shows a normal right kidney with no hydronephrosis. No other complaints today.   Her records from [facility] are as follows:  I have chronic cystitis. HPI: Crystal York is a 71 year-old female established patient who is here for chronic cystitis.??She does not have a burning sensation when she urinates. She does not have to strain or bear down to start her urinary stream. She is not having problems getting her urine stream started. She is currently having trouble urinating. She is not having problems with emptying her bladder well. ??She is having problems with urinary control or incontinence. She is not urinating more frequently now than usual. ??She does not have pelvic or rectal pain related to voiding. ??04/29/2016: She gets 1-2 UTIs per year. She has mild urgency and uses 1 pad per day. She has nocturia 4-5x. ??05/05/2017: She has not had any UTIs in the last year. She has mild urgency and continues to use 0-1 pads per day. She continues to have nocturia 4-5x. ??11/10/2017: No UTIs since last visit ??CC: I get up too often at night to urinate. HPI: She first noticed the symptom approximately 04/17/2012. She usually gets up at night to urinate 2 times. She does not have nights when she does not get up to urinate at all. She does not have trouble falling back asleep once she has been woken up at night. ??She does not usually have swelling in her hands and feet during the day. She does not take a diuretic. She does not have to strain or bear down to start her urinary stream. ??04/29/2016: She has mild  urgency with urge incontinence on occasion, maybe once per month. She has a feeling of incomplete emptying. She has intermittency of her stream. She is currently being treated with amoxicillin for a UTI. ??05/05/2017: She continues to have nocturia 4-5x. She has 2+ pitting edema. She has never been checked for OSA. She does not wear compression stockings ??11/10/2017: Nocturia 2x. She has not been checked for OSA. she is not wearing compression stockings. She drinks tea once a day. ?    PMH: Past Medical History:  Diagnosis Date   Anemia    ASCUS of cervix with negative high risk HPV 04/2017   Chronic back pain    chronic pain "deteriorating"   Colon cancer [facility])    '08- chemotherapy after surgery   Colon cancer ([place])    H/O seasonal allergies    History of kidney stones    Renal cancer [facility])    '08 left neprectomy    Surgical History: Past Surgical History:  Procedure Laterality Date   BALLOON DILATION N/A 03/24/2019   Procedure: BALLOON DILATION;  Surgeon: Carol Ada, MD;  Location: [facility];  Service: Endoscopy;  Laterality: N/A;   BARIATRIC SURGERY     CARPAL TUNNEL RELEASE     CATARACT EXTRACTION, BILATERAL     lens implants   COLON SURGERY     COLONOSCOPY WITH PROPOFOL N/A 11/03/2013   Procedure: COLONOSCOPY WITH PROPOFOL;  Surgeon: Beryle Beams, MD;  Location: [facility];  Service: Endoscopy;  Laterality: N/A;   COLONOSCOPY WITH PROPOFOL N/A 03/24/2019   Procedure: COLONOSCOPY WITH PROPOFOL;  Surgeon: Carol Ada, MD;  Location: [facility];  Service: Endoscopy;  Laterality: N/A;   COLPOSCOPY     ESOPHAGOGASTRODUODENOSCOPY (EGD) WITH PROPOFOL N/A 03/24/2019   Procedure: ESOPHAGOGASTRODUODENOSCOPY (EGD) WITH PROPOFOL;  Surgeon: Carol Ada, MD;  Location: [facility];  Service: Endoscopy;  Laterality: N/A;   GASTRIC BYPASS  1982   HAND SURGERY     carpel tunnel surgery both hands   HEMORRHOID SURGERY  09/1969   HEMORRHOID SURGERY     HERNIA REPAIR   09/2006   HYSTEROSCOPY  10/2009   Endometrial polyp   KIDNEY SURGERY     NEPHRECTOMY     POLYPECTOMY  03/24/2019   Procedure: POLYPECTOMY;  Surgeon: Carol Ada, MD;  Location: [facility];  Service: Endoscopy;;   radioactive therapy on back  2013   [place] N/A 03/28/2015   Procedure: MRI LUMBER SPINE WITHOUT;  Surgeon: Medication Radiologist, MD;  Location: [place];  Service: Radiology;  Laterality: N/A;   TONSILLECTOMY  09/1969   TUBAL LIGATION      Home Medications:  Allergies as of 06/18/2021       Reactions   Hydrocodone Nausea And Vomiting   Ciprofloxacin Swelling, Rash   Codeine Nausea And Vomiting, Rash   Macrodantin [nitrofurantoin] Nausea And Vomiting, Rash   Sulfa Antibiotics Rash        Medication List        Accurate as of June 18, 2021  2:35 PM. If you have any questions, ask your nurse or doctor.          clobetasol ointment 0.05 % Commonly known as: TEMOVATE Apply 1 application topically 2 (two) times daily for 14 days. Thin application on affected vulva.  Start weaning progressively after 2 weeks.   COLLAGEN PO Take by mouth.   diazepam 5 MG tablet Commonly known as: VALIUM Take 5 mg by mouth daily as needed (vertigo). Reported on 09/17/2015   diphenhydrAMINE 25 MG tablet Commonly known as: BENADRYL Take 25 mg by mouth daily as needed for allergies.   Durezol 0.05 % Emul Generic drug: Difluprednate Apply 1 drop to eye 4 (four) times daily.   Elderberry 575 MG/5ML Syrp   multivitamin tablet Take 1 tablet by mouth daily. Diet plan vitamin   prednisoLONE acetate 1 % ophthalmic suspension Commonly known as: PRED FORTE SMARTSIG:1 Drop(s) In Eye(s) 5 Times Daily   SALONPAS TD Place 1 patch onto the skin daily. With Lidocaine        Allergies:  Allergies  Allergen Reactions   Hydrocodone Nausea And Vomiting   Ciprofloxacin Swelling and Rash   Codeine Nausea And Vomiting and Rash   Macrodantin [Nitrofurantoin]  Nausea And Vomiting and Rash   Sulfa Antibiotics Rash    Family History: Family History  Problem Relation Age of Onset   Diabetes Mother    Hypertension Mother    Colon cancer Sister 63   Pancreatic cancer Sister 63   Lung cancer Sister 55       smoker   Lung cancer Other        niece; w/ metastasis; smoker   Colon cancer Other 64       niece w/ colon rupture - pt believes due to cancer   Colon cancer Father 26       infection vs. colon cancer   Heart Problems Father    Rheum arthritis Maternal Grandmother  Other Daughter        endometrial polyps   Cancer Sister        NOS cancer; no further info   Other Brother        NOS blood disorder   Hodgkin's lymphoma Brother 35   Cancer Brother        Lymphoma   Lung cancer Brother    Leukemia Brother    Prostate cancer Other        nephew dx. late 29s   Breast cancer Other        niece dx. 61    Social History:  reports that she quit smoking about 26 years ago. Her smoking use included cigarettes. She has a 0.38 pack-year smoking history. She has never used smokeless tobacco. She reports that she does not drink alcohol and does not use drugs.  ROS: All other review of systems were reviewed and are negative except what is noted above in HPI  Physical Exam: BP 138/75    Pulse 86    LMP 10/01/2010   Constitutional:  Alert and oriented, No acute distress. HEENT: [place] AT, moist mucus membranes.  Trachea midline, no masses. Cardiovascular: No clubbing, cyanosis, or edema. Respiratory: Normal respiratory effort, no increased work of breathing. GI: Abdomen is soft, nontender, nondistended, no abdominal masses GU: No CVA tenderness.  Lymph: No cervical or inguinal lymphadenopathy. Skin: No rashes, bruises or suspicious lesions. Neurologic: Grossly intact, no focal deficits, moving all 4 extremities. Psychiatric: Normal mood and affect.  Laboratory Data: Lab Results  Component Value Date   WBC 8.9 03/26/2015   HGB 12.5  03/26/2015   HCT 37.6 03/26/2015   MCV 86.0 03/26/2015   PLT 167 03/26/2015    Lab Results  Component Value Date   CREATININE 1.24 (H) 10/01/2017    No results found for: PSA  No results found for: TESTOSTERONE  No results found for: HGBA1C  Urinalysis    Component Value Date/Time   COLORURINE YELLOW 04/21/2016 [place] 04/21/2016 1433   LABSPEC 1.012 04/21/2016 1433   LABSPEC 1.020 03/01/2007 1002   PHURINE 6.0 04/21/2016 1433   GLUCOSEU NEGATIVE 04/21/2016 1433   HGBUR NEGATIVE 04/21/2016 1433   BILIRUBINUR NEGATIVE 04/21/2016 1433   BILIRUBINUR Negative 03/01/2007 1002   KETONESUR NEGATIVE 04/21/2016 1433   PROTEINUR NEGATIVE 04/21/2016 1433   UROBILINOGEN 0.2 11/21/2013 1208   NITRITE NEGATIVE 04/21/2016 1433   LEUKOCYTESUR 3+ (A) 04/21/2016 1433   LEUKOCYTESUR Moderate 03/01/2007 1002    Lab Results  Component Value Date   BACTERIA FEW (A) 04/21/2016    Pertinent Imaging:  No results found for this or any previous visit.  No results found for this or any previous visit.  No results found for this or any previous visit.  No results found for this or any previous visit.  Results for orders placed during the hospital encounter of 05/06/16  US Renal  Narrative CLINICAL DATA:  Renal carcinoma post LEFT nephrectomy, history colon cancer  EXAM: RENAL / URINARY TRACT ULTRASOUND COMPLETE  COMPARISON:  CT abdomen and pelvis 10/05/2014  FINDINGS: Right Kidney:  Length: 11.7 cm. Normal cortical thickness and echogenicity. No mass, hydronephrosis or shadowing calcification. No perinephric fluid.  Left Kidney:  Surgically absent  Bladder:  Decompressed, unable to adequately evaluate  Echogenic, likely fatty infiltration, though this can be seen with cirrhosis and certain infiltrative disorders.  IMPRESSION: Post LEFT nephrectomy.  Unremarkable RIGHT kidney.  Echogenic liver question fatty infiltration though this  can also  be seen with cirrhosis and certain infiltrative disorders.   Electronically Signed By: Lavonia Dana M.D. On: 05/06/2016 16:28  No results found for this or any previous visit.  No results found for this or any previous visit.  No results found for this or any previous visit.   Assessment & Plan:    1. Chronic cystitis without hematuria -Resolved  2. Nocturia -resolved RTC 1 year with renal US   No follow-ups on file.  Nicolette Bang, MD  [facility]

## 2021-06-18 NOTE — Progress Notes (Signed)
Urological Symptom Review  Patient is experiencing the following symptoms: none   Review of Systems  Gastrointestinal (upper)  : Negative for upper GI symptoms  Gastrointestinal (lower) : Negative for lower GI symptoms  Constitutional : Negative for symptoms  Skin: Negative for skin symptoms  Eyes: Negative for eye symptoms  Ear/Nose/Throat : Negative for Ear/Nose/Throat symptoms  Hematologic/Lymphatic: Negative for Hematologic/Lymphatic symptoms  Cardiovascular : Negative for cardiovascular symptoms  Respiratory : Negative for respiratory symptoms  Endocrine: Negative for endocrine symptoms  Musculoskeletal: Back pain  Neurological: Dizziness  Psychologic: Negative for psychiatric symptoms

## 2021-12-04 ENCOUNTER — Inpatient Hospital Stay: Payer: Medicare HMO

## 2021-12-04 ENCOUNTER — Inpatient Hospital Stay: Payer: Medicare HMO | Attending: Oncology | Admitting: Oncology

## 2021-12-04 VITALS — BP 141/71 | HR 67 | Temp 98.5°F | Resp 20 | Wt 289.4 lb

## 2021-12-04 DIAGNOSIS — C187 Malignant neoplasm of sigmoid colon: Secondary | ICD-10-CM

## 2021-12-04 DIAGNOSIS — L728 Other follicular cysts of the skin and subcutaneous tissue: Secondary | ICD-10-CM | POA: Diagnosis not present

## 2021-12-04 DIAGNOSIS — G8929 Other chronic pain: Secondary | ICD-10-CM | POA: Diagnosis not present

## 2021-12-04 DIAGNOSIS — R634 Abnormal weight loss: Secondary | ICD-10-CM | POA: Insufficient documentation

## 2021-12-04 DIAGNOSIS — Z85038 Personal history of other malignant neoplasm of large intestine: Secondary | ICD-10-CM | POA: Diagnosis present

## 2021-12-04 DIAGNOSIS — Z85528 Personal history of other malignant neoplasm of kidney: Secondary | ICD-10-CM | POA: Diagnosis not present

## 2021-12-04 LAB — CBC WITH DIFFERENTIAL (CANCER CENTER ONLY)
Abs Immature Granulocytes: 0.02 10*3/uL (ref 0.00–0.07)
Basophils Absolute: 0 10*3/uL (ref 0.0–0.1)
Basophils Relative: 0 %
Eosinophils Absolute: 0.2 10*3/uL (ref 0.0–0.5)
Eosinophils Relative: 3 %
HCT: 38.5 % (ref 36.0–46.0)
Hemoglobin: 12.4 g/dL (ref 12.0–15.0)
Immature Granulocytes: 0 %
Lymphocytes Relative: 24 %
Lymphs Abs: 1.6 10*3/uL (ref 0.7–4.0)
MCH: 28.7 pg (ref 26.0–34.0)
MCHC: 32.2 g/dL (ref 30.0–36.0)
MCV: 89.1 fL (ref 80.0–100.0)
Monocytes Absolute: 0.4 10*3/uL (ref 0.1–1.0)
Monocytes Relative: 6 %
Neutro Abs: 4.4 10*3/uL (ref 1.7–7.7)
Neutrophils Relative %: 67 %
Platelet Count: 164 10*3/uL (ref 150–400)
RBC: 4.32 MIL/uL (ref 3.87–5.11)
RDW: 14.3 % (ref 11.5–15.5)
WBC Count: 6.7 10*3/uL (ref 4.0–10.5)
nRBC: 0 % (ref 0.0–0.2)

## 2021-12-04 LAB — CEA (ACCESS): CEA (CHCC): 1.36 ng/mL (ref 0.00–5.00)

## 2021-12-04 NOTE — Progress Notes (Signed)
  [place] OFFICE PROGRESS NOTE   Diagnosis: Colon cancer, renal cell carcinoma  INTERVAL HISTORY:   Crystal York returns as scheduled.  She feels well.  She reports weight loss with a change in her diet.  No difficulty with bowel function.  No bleeding.  She has a chronic tender "cyst "at the right lower back/buttock.  Objective:  Vital signs in last 24 hours:  Blood pressure (!) 141/71, pulse 67, temperature 98.5 F (36.9 C), temperature source Oral, resp. rate 20, weight 289 lb 6.4 oz (131.3 kg), last menstrual period 10/01/2010, SpO2 99 %.     Lymphatics: No cervical, supraclavicular, axillary, or inguinal nodes Resp: Lungs clear bilaterally Cardio: Regular rate and rhythm GI: No hepatosplenomegaly Vascular: Chronic stasis change of the lower leg bilaterally  Skin: Tender cutaneous nodular area at the right lower back  Portacath/PICC-without erythema  Lab Results:  Lab Results  Component Value Date   WBC 6.7 12/04/2021   HGB 12.4 12/04/2021   HCT 38.5 12/04/2021   MCV 89.1 12/04/2021   PLT 164 12/04/2021   NEUTROABS 4.4 12/04/2021    CMP  Lab Results  Component Value Date   NA 142 10/01/2017   K 4.3 10/01/2017   CL 108 10/01/2017   CO2 29 10/01/2017   GLUCOSE 94 10/01/2017   BUN 12 10/01/2017   CREATININE 1.24 (H) 10/01/2017   CALCIUM 9.1 10/01/2017   PROT 7.0 04/26/2012   ALBUMIN 3.8 04/26/2012   AST 14 04/26/2012   ALT 17 04/26/2012   ALKPHOS 62 04/26/2012   BILITOT 0.4 04/26/2012   GFRNONAA 44 (L) 10/01/2017   GFRAA 51 (L) 10/01/2017    Lab Results  Component Value Date   CEA1 1.20 12/05/2020   CEA 1.36 12/04/2021     Medications: I have reviewed the patient's current medications.   Assessment/Plan: Stage IIIC (T4 ,N2) colon cancer, status post partial colectomy 10/04/2006, followed by 12 cycles of adjuvant chemotherapy. Restaging CT evaluation 07/31/2009 showed no evidence of recurrent colon cancer. Restaging CT evaluation  08/01/2010 showed no evidence of recurrent colon cancer.   CT abdomen/pelvis-renal protocol  10/05/2014-negative for recurrent cancer. Status post colonoscopy by Dr. Benson Norway 10/02/2008 with findings of a sessile polyp in the ascending colon and small hemorrhoids. The pathology from the polyp confirmed a tubular adenoma. Negative colonoscopy 11/03/2013  Colonoscopy 03/24/2019-polyps removed from the transverse and ascending colon, tubular adenomas History of anemia. Enlarged prevascular lymph node on CT 11/03/2006, stable on CT scans April 2009, March 2010 and March 2011. History of renal insufficiency. History of an elevated preoperative CEA. CEA was normal on 10/01/2017 Status post left nephrectomy for stage I renal cell carcinoma 10/04/2006.  "Cystic" lesion of the subcutaneous tissue at the right lumbar level. A cyst has been present in this area dating back to CT scans from 2008. This is felt to likely be a benign finding. Chronic back pain-followed by a pain clinic     Disposition: Ms. Schnieders is in clinical remission from colon cancer.  The CBC and CEA are in the normal range.  She will continue colonoscopy surveillance with Dr. Benson Norway. The right lower back cystic lesion appears to be chronic.  I recommended she discuss a referral for surgical removal with Dr. Redmond Pulling.   Ms. Orene Desanctis would like to continue follow-up with cancer center.  She will return for an office visit in 1 year. Betsy Coder, MD  12/04/2021  11:59 AM

## 2021-12-05 ENCOUNTER — Telehealth: Payer: Self-pay

## 2021-12-05 NOTE — Telephone Encounter (Signed)
Pt verbalized understanding.

## 2021-12-05 NOTE — Telephone Encounter (Signed)
-----   Message from Ladell Pier, MD sent at 12/04/2021  5:43 PM EDT ----- Please call patient

## 2022-05-25 ENCOUNTER — Ambulatory Visit (HOSPITAL_COMMUNITY)
Admission: RE | Admit: 2022-05-25 | Discharge: 2022-05-25 | Disposition: A | Discharge status: Home / Self Care | Payer: Medicare HMO | Source: Ambulatory Visit | Attending: Urology | Admitting: Urology

## 2022-05-25 DIAGNOSIS — N2889 Other specified disorders of kidney and ureter: Secondary | ICD-10-CM | POA: Insufficient documentation

## 2022-05-25 DIAGNOSIS — N3020 Other chronic cystitis without hematuria: Secondary | ICD-10-CM | POA: Diagnosis not present

## 2022-05-25 DIAGNOSIS — N200 Calculus of kidney: Secondary | ICD-10-CM | POA: Diagnosis not present

## 2022-05-27 ENCOUNTER — Encounter: Payer: Self-pay | Admitting: Urology

## 2022-05-27 ENCOUNTER — Ambulatory Visit (INDEPENDENT_AMBULATORY_CARE_PROVIDER_SITE_OTHER): Payer: Medicare HMO | Admitting: Urology

## 2022-05-27 VITALS — BP 125/63 | HR 76

## 2022-05-27 DIAGNOSIS — R351 Nocturia: Secondary | ICD-10-CM | POA: Diagnosis not present

## 2022-05-27 DIAGNOSIS — C642 Malignant neoplasm of left kidney, except renal pelvis: Secondary | ICD-10-CM | POA: Diagnosis not present

## 2022-05-27 MED ORDER — NYSTATIN 100000 UNIT/GM EX POWD: 1.0000 | Freq: Three times a day (TID) | CUTANEOUS | 5 refills | Status: DC

## 2022-05-27 NOTE — Progress Notes (Signed)
05/27/2022 4:05 PM   Crystal York 1951-04-27 [id number]  Referring provider: Christain Sacramento, MD [address] [place],  [place] [address]  nocturia   HPI: Crystal York is a 72yo here for followup for nocturia, UTI and history of left RCC. Renal US 05/25/2022 shows mild right renal pelvis fullness. Nocturia 3-4x which is a larger volume. She had 2 UTIs in the past year.    PMH: Past Medical History:  Diagnosis Date   Anemia    ASCUS of cervix with negative high risk HPV 04/2017   Chronic back pain    chronic pain "deteriorating"   Colon cancer [facility])    '08- chemotherapy after surgery   Colon cancer ([place])    H/O seasonal allergies    History of kidney stones    Renal cancer [facility])    '08 left neprectomy    Surgical History: Past Surgical History:  Procedure Laterality Date   BALLOON DILATION N/A 03/24/2019   Procedure: BALLOON DILATION;  Surgeon: Carol Ada, MD;  Location: [facility];  Service: Endoscopy;  Laterality: N/A;   BARIATRIC SURGERY     CARPAL TUNNEL RELEASE     CATARACT EXTRACTION, BILATERAL     lens implants   COLON SURGERY     COLONOSCOPY WITH PROPOFOL N/A 11/03/2013   Procedure: COLONOSCOPY WITH PROPOFOL;  Surgeon: Beryle Beams, MD;  Location: [facility];  Service: Endoscopy;  Laterality: N/A;   COLONOSCOPY WITH PROPOFOL N/A 03/24/2019   Procedure: COLONOSCOPY WITH PROPOFOL;  Surgeon: Carol Ada, MD;  Location: [facility];  Service: Endoscopy;  Laterality: N/A;   COLPOSCOPY     ESOPHAGOGASTRODUODENOSCOPY (EGD) WITH PROPOFOL N/A 03/24/2019   Procedure: ESOPHAGOGASTRODUODENOSCOPY (EGD) WITH PROPOFOL;  Surgeon: Carol Ada, MD;  Location: [facility];  Service: Endoscopy;  Laterality: N/A;   GASTRIC BYPASS  1982   HAND SURGERY     carpel tunnel surgery both hands   HEMORRHOID SURGERY  09/1969   HEMORRHOID SURGERY     HERNIA REPAIR  09/2006   HYSTEROSCOPY  10/2009   Endometrial polyp   KIDNEY SURGERY     NEPHRECTOMY     POLYPECTOMY   03/24/2019   Procedure: POLYPECTOMY;  Surgeon: Carol Ada, MD;  Location: [facility];  Service: Endoscopy;;   radioactive therapy on back  2013   [place] N/A 03/28/2015   Procedure: MRI LUMBER SPINE WITHOUT;  Surgeon: Medication Radiologist, MD;  Location: [place];  Service: Radiology;  Laterality: N/A;   TONSILLECTOMY  09/1969   TUBAL LIGATION      Home Medications:  Allergies as of 05/27/2022       Reactions   Hydrocodone Nausea And Vomiting   Ciprofloxacin Swelling, Rash   Codeine Nausea And Vomiting, Rash   Macrodantin [nitrofurantoin] Nausea And Vomiting, Rash   Sulfa Antibiotics Rash        Medication List        Accurate as of May 27, 2022  4:05 PM. If you have any questions, ask your nurse or doctor.          COLLAGEN PO Take by mouth.   diazepam 5 MG tablet Commonly known as: VALIUM Take 5 mg by mouth daily as needed (vertigo). Reported on 09/17/2015   diphenhydrAMINE 25 MG tablet Commonly known as: BENADRYL Take 25 mg by mouth daily as needed for allergies.   Durezol 0.05 % Emul Generic drug: Difluprednate Apply 1 drop to eye 4 (four) times daily.   Elderberry 575 MG/5ML Syrp  multivitamin tablet Take 1 tablet by mouth daily. Diet plan vitamin   PATADAY OP Apply to eye daily.   prednisoLONE acetate 1 % ophthalmic suspension Commonly known as: PRED FORTE SMARTSIG:1 Drop(s) In Eye(s) 5 Times Daily   Refresh Tears 0.5 % Soln Generic drug: carboxymethylcellulose 1 drop daily.   SALONPAS TD Place 1 patch onto the skin daily. With Lidocaine        Allergies:  Allergies  Allergen Reactions   Hydrocodone Nausea And Vomiting   Ciprofloxacin Swelling and Rash   Codeine Nausea And Vomiting and Rash   Macrodantin [Nitrofurantoin] Nausea And Vomiting and Rash   Sulfa Antibiotics Rash    Family History: Family History  Problem Relation Age of Onset   Diabetes Mother    Hypertension Mother    Colon cancer Sister 61    Pancreatic cancer Sister 8   Lung cancer Sister 49       smoker   Lung cancer Other        niece; w/ metastasis; smoker   Colon cancer Other 28       niece w/ colon rupture - pt believes due to cancer   Colon cancer Father 69       infection vs. colon cancer   Heart Problems Father    Rheum arthritis Maternal Grandmother    Other Daughter        endometrial polyps   Cancer Sister        NOS cancer; no further info   Other Brother        NOS blood disorder   Hodgkin's lymphoma Brother 49   Cancer Brother        Lymphoma   Lung cancer Brother    Leukemia Brother    Prostate cancer Other        nephew dx. late 52s   Breast cancer Other        niece dx. 41    Social History:  reports that she quit smoking about 27 years ago. Her smoking use included cigarettes. She has a 0.38 pack-year smoking history. She has never used smokeless tobacco. She reports that she does not drink alcohol and does not use drugs.  ROS: All other review of systems were reviewed and are negative except what is noted above in HPI  Physical Exam: BP 125/63   Pulse 76   LMP 10/01/2010   Constitutional:  Alert and oriented, No acute distress. HEENT: [place] AT, moist mucus membranes.  Trachea midline, no masses. Cardiovascular: No clubbing, cyanosis, or edema. Respiratory: Normal respiratory effort, no increased work of breathing. GI: Abdomen is soft, nontender, nondistended, no abdominal masses GU: No CVA tenderness.  Lymph: No cervical or inguinal lymphadenopathy. Skin: No rashes, bruises or suspicious lesions. Neurologic: Grossly intact, no focal deficits, moving all 4 extremities. Psychiatric: Normal mood and affect.  Laboratory Data: Lab Results  Component Value Date   WBC 6.7 12/04/2021   HGB 12.4 12/04/2021   HCT 38.5 12/04/2021   MCV 89.1 12/04/2021   PLT 164 12/04/2021    Lab Results  Component Value Date   CREATININE 1.24 (H) 10/01/2017    No results found for: "PSA"  No  results found for: "TESTOSTERONE"  No results found for: "HGBA1C"  Urinalysis    Component Value Date/Time   COLORURINE YELLOW 04/21/2016 1433   APPEARANCEUR Clear 06/18/2021 1543   LABSPEC 1.012 04/21/2016 1433   LABSPEC 1.020 03/01/2007 1002   PHURINE 6.0 04/21/2016 1433   GLUCOSEU Negative 06/18/2021  [place] 04/21/2016 1433   BILIRUBINUR Negative 06/18/2021 1543   BILIRUBINUR Negative 03/01/2007 1002   KETONESUR NEGATIVE 04/21/2016 1433   PROTEINUR Negative 06/18/2021 1543   PROTEINUR NEGATIVE 04/21/2016 1433   UROBILINOGEN 0.2 11/21/2013 1208   NITRITE Negative 06/18/2021 1543   NITRITE NEGATIVE 04/21/2016 1433   LEUKOCYTESUR 2+ (A) 06/18/2021 1543   LEUKOCYTESUR Moderate 03/01/2007 1002    Lab Results  Component Value Date   LABMICR See below: 06/18/2021   WBCUA 11-30 (A) 06/18/2021   LABEPIT >10 (A) 06/18/2021   BACTERIA Many (A) 06/18/2021    Pertinent Imaging: Renal US 05/25/2022: Images reviewed and discussed with the patient  No results found for this or any previous visit.  No results found for this or any previous visit.  No results found for this or any previous visit.  No results found for this or any previous visit.  Results for orders placed during the hospital encounter of 05/25/22  Ultrasound renal complete  Narrative CLINICAL DATA:  Nephrolithiasis and cystitis. 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  EXAM: RENAL / URINARY TRACT ULTRASOUND COMPLETE  COMPARISON:  May 06, 2016  FINDINGS: Right Kidney:  Renal measurements: 11.9 x 5 x 6.4 cm = volume: 200.8 mL. Echogenicity within normal limits. Mild dilatation of the right renal pelvis is identified.  Left Kidney:  Prior nephrectomy.  Bladder:  Not well seen per ultrasound technologist.  Other:  None.  IMPRESSION: Mild dilatation of the right renal pelvis.   Electronically Signed By: Abelardo Diesel M.D. On: 05/25/2022  11:10  No valid procedures specified. No results found for this or any previous visit.  No results found for this or any previous visit.   Assessment & Plan:    1. Carcinoma of left kidney ([place]) -followup 2 years with renal US - Urinalysis, Routine w reflex microscopic  2. Nocturia -decrease fluid intake within 2 hours of going to bed.    No follow-ups on file.  Nicolette Bang, MD  [facility] [place]

## 2022-05-27 NOTE — Patient Instructions (Signed)
Urinary Tract Infection, Adult  A urinary tract infection (UTI) is an infection of any part of the urinary tract. The urinary tract includes the kidneys, ureters, bladder, and urethra. These organs make, store, and get rid of urine in the body. An upper UTI affects the ureters and kidneys. A lower UTI affects the bladder and urethra. What are the causes? Most urinary tract infections are caused by bacteria in your genital area around your urethra, where urine leaves your body. These bacteria grow and cause inflammation of your urinary tract. What increases the risk? You are more likely to develop this condition if: You have a urinary catheter that stays in place. You are not able to control when you urinate or have a bowel movement (incontinence). You are female and you: Use a spermicide or diaphragm for birth control. Have low estrogen levels. Are pregnant. You have certain genes that increase your risk. You are sexually active. You take antibiotic medicines. You have a condition that causes your flow of urine to slow down, such as: An enlarged prostate, if you are female. Blockage in your urethra. A kidney stone. A nerve condition that affects your bladder control (neurogenic bladder). Not getting enough to drink, or not urinating often. You have certain medical conditions, such as: Diabetes. A weak disease-fighting system (immunesystem). Sickle cell disease. Gout. Spinal cord injury. What are the signs or symptoms? Symptoms of this condition include: Needing to urinate right away (urgency). Frequent urination. This may include small amounts of urine each time you urinate. Pain or burning with urination. Blood in the urine. Urine that smells bad or unusual. Trouble urinating. Cloudy urine. Vaginal discharge, if you are female. Pain in the abdomen or the lower back. You may also have: Vomiting or a decreased appetite. Confusion. Irritability or tiredness. A fever or  chills. Diarrhea. The first symptom in older adults may be confusion. In some cases, they may not have any symptoms until the infection has worsened. How is this diagnosed? This condition is diagnosed based on your medical history and a physical exam. You may also have other tests, including: Urine tests. Blood tests. Tests for STIs (sexually transmitted infections). If you have had more than one UTI, a cystoscopy or imaging studies may be done to determine the cause of the infections. How is this treated? Treatment for this condition includes: Antibiotic medicine. Over-the-counter medicines to treat discomfort. Drinking enough water to stay hydrated. If you have frequent infections or have other conditions such as a kidney stone, you may need to see a health care provider who specializes in the urinary tract (urologist). In rare cases, urinary tract infections can cause sepsis. Sepsis is a life-threatening condition that occurs when the body responds to an infection. Sepsis is treated in the hospital with IV antibiotics, fluids, and other medicines. Follow these instructions at home:  Medicines Take over-the-counter and prescription medicines only as told by your health care provider. If you were prescribed an antibiotic medicine, take it as told by your health care provider. Do not stop using the antibiotic even if you start to feel better. General instructions Make sure you: Empty your bladder often and completely. Do not hold urine for long periods of time. Empty your bladder after sex. Wipe from front to back after urinating or having a bowel movement if you are female. Use each tissue only one time when you wipe. Drink enough fluid to keep your urine pale yellow. Keep all follow-up visits. This is important. Contact a health   care provider if: Your symptoms do not get better after 1-2 days. Your symptoms go away and then return. Get help right away if: You have severe pain in  your back or your lower abdomen. You have a fever or chills. You have nausea or vomiting. Summary A urinary tract infection (UTI) is an infection of any part of the urinary tract, which includes the kidneys, ureters, bladder, and urethra. Most urinary tract infections are caused by bacteria in your genital area. Treatment for this condition often includes antibiotic medicines. If you were prescribed an antibiotic medicine, take it as told by your health care provider. Do not stop using the antibiotic even if you start to feel better. Keep all follow-up visits. This is important. This information is not intended to replace advice given to you by your health care provider. Make sure you discuss any questions you have with your health care provider. Document Revised: 12/15/2019 Document Reviewed: 12/15/2019 Elsevier Patient Education  2023 Elsevier Inc.  

## 2022-05-28 LAB — URINALYSIS, ROUTINE W REFLEX MICROSCOPIC
Bilirubin, UA: NEGATIVE
Glucose, UA: NEGATIVE
Ketones, UA: NEGATIVE
Nitrite, UA: NEGATIVE
RBC, UA: NEGATIVE
Specific Gravity, UA: 1.02 (ref 1.005–1.030)
Urobilinogen, Ur: 0.2 mg/dL (ref 0.2–1.0)
pH, UA: 5.5 (ref 5.0–7.5)

## 2022-05-28 LAB — MICROSCOPIC EXAMINATION
Bacteria, UA: NONE SEEN
Epithelial Cells (non renal): >10 /hpf — AB (ref 0–10)

## 2022-08-04 ENCOUNTER — Ambulatory Visit (INDEPENDENT_AMBULATORY_CARE_PROVIDER_SITE_OTHER): Payer: Medicare HMO | Admitting: Obstetrics & Gynecology

## 2022-08-04 ENCOUNTER — Encounter: Payer: Self-pay | Admitting: Obstetrics & Gynecology

## 2022-08-04 VITALS — BP 118/70 | HR 73 | Ht 63.25 in | Wt 297.0 lb

## 2022-08-04 DIAGNOSIS — Z01419 Encounter for gynecological examination (general) (routine) without abnormal findings: Secondary | ICD-10-CM

## 2022-08-04 DIAGNOSIS — R87610 Atypical squamous cells of undetermined significance on cytologic smear of cervix (ASC-US): Secondary | ICD-10-CM

## 2022-08-04 DIAGNOSIS — Z9189 Other specified personal risk factors, not elsewhere classified: Secondary | ICD-10-CM | POA: Diagnosis not present

## 2022-08-04 DIAGNOSIS — Z780 Asymptomatic menopausal state: Secondary | ICD-10-CM

## 2022-08-04 DIAGNOSIS — N9089 Other specified noninflammatory disorders of vulva and perineum: Secondary | ICD-10-CM | POA: Diagnosis not present

## 2022-08-04 DIAGNOSIS — Z9289 Personal history of other medical treatment: Secondary | ICD-10-CM

## 2022-08-04 MED ORDER — NYSTATIN-TRIAMCINOLONE 100000-0.1 UNIT/GM-% EX OINT: 1.0000 | TOPICAL_OINTMENT | Freq: Every day | CUTANEOUS | 4 refills | Status: DC | PRN

## 2022-08-04 NOTE — Progress Notes (Signed)
Crystal York [facility] 17-May-1951 NP:[id number]   History:    72 y.o. G2P2L2 Married   RP:  Established patient presenting for annual gyn exam    HPI: Postmenopausal, well on no HRT.  No PMB.  No pelvic pain. C/O vulvar irritation.  Pap smear Neg in 05/2021.  ASCUS/Negative high-risk HPV 2018, otherwise all previous Pap smears were normal. No indication for a Pap test today. Breasts normal.  Mammogram Neg in 05/2019.  Schedule screening mammo now.  Colonoscopy 2020. DEXA 2020 normal.  Next DEXA recommended at 5 year interval. Health labs with Fam MD.  BMI 52.2.     Past medical history,surgical history, family history and social history were all reviewed and documented in the EPIC chart.  Gynecologic History Patient's last menstrual period was 10/01/2010.  Obstetric History OB History  Gravida Para Term Preterm AB Living  2 2 2     2   SAB IAB Ectopic Multiple Live Births               # Outcome Date GA Lbr Len/2nd Weight Sex Delivery Anes PTL Lv  2 Term           1 Term              ROS: A ROS was performed and pertinent positives and negatives are included in the history. GENERAL: No fevers or chills. HEENT: No change in vision, no earache, sore throat or sinus congestion. NECK: No pain or stiffness. CARDIOVASCULAR: No chest pain or pressure. No palpitations. PULMONARY: No shortness of breath, cough or wheeze. GASTROINTESTINAL: No abdominal pain, nausea, vomiting or diarrhea, melena or bright red blood per rectum. GENITOURINARY: No urinary frequency, urgency, hesitancy or dysuria. MUSCULOSKELETAL: No joint or muscle pain, no back pain, no recent trauma. DERMATOLOGIC: No rash, no itching, no lesions. ENDOCRINE: No polyuria, polydipsia, no heat or cold intolerance. No recent change in weight. HEMATOLOGICAL: No anemia or easy bruising or bleeding. NEUROLOGIC: No headache, seizures, numbness, tingling or weakness. PSYCHIATRIC: No depression, no loss of interest in normal activity or change in sleep  pattern.     Exam:   BP 118/70   Pulse 73   Ht 5' 3.25" (1.607 m)   Wt 297 lb (134.7 kg)   LMP 10/01/2010 Comment: no sexually active  SpO2 99%   BMI 52.20 kg/m   Body mass index is 52.2 kg/m.  General appearance : Well developed well nourished female. No acute distress HEENT: Eyes: no retinal hemorrhage or exudates,  Neck supple, trachea midline, no carotid bruits, no thyroidmegaly Lungs: Clear to auscultation, no rhonchi or wheezes, or rib retractions  Heart: Regular rate and rhythm, no murmurs or gallops Breast:Examined in sitting and supine position were symmetrical in appearance, no palpable masses or tenderness,  no skin retraction, no nipple inversion, no nipple discharge, no skin discoloration, no axillary or supraclavicular lymphadenopathy Abdomen: no palpable masses or tenderness, no rebound or guarding Extremities: no edema or skin discoloration or tenderness  Pelvic: Vulva: Mild erythema with atrophy             Vagina: No gross lesions or discharge  Cervix: No gross lesions or discharge  Uterus  AV, normal size, shape and consistency, non-tender and mobile  Adnexa  Without masses or tenderness  Anus: Normal   Assessment/Plan:  72 y.o. female for annual exam   1. Well female exam with routine gynecological exam Postmenopausal, well on no HRT.  No PMB.  No pelvic pain. C/O vulvar irritation.  Pap smear Neg in 05/2021.  ASCUS/Negative high-risk HPV 2018, otherwise all previous Pap smears were normal. No indication for a Pap test today. Breasts normal.  Mammogram Neg in 05/2019.  Schedule screening mammo now.  Colonoscopy 2020. DEXA 2020 normal.  Next DEXA recommended at 5 year interval. Health labs with Fam MD.  BMI 52.2.    2. Postmenopause Postmenopausal, well on no HRT.  No PMB.  No pelvic pain.   3. Vulvar irritation C/O vulvar irritation.  Vulvar exam: Mild erythema with atrophy.  Will treat with Mycolog ointment.  Usage reviewed.  Prescription sent to  pharmacy.  4. Morbid obesity ([place])  5. Other specified personal risk factors, not elsewhere classified  6. Personal history of other medical treatment  Other orders - ELDERBERRY PO; Take by mouth. Takes 2 gummies - IBUPROFEN PO; Take by mouth. - nystatin-triamcinolone ointment (MYCOLOG); Apply 1 Application topically daily as needed.   Princess Bruins MD, 11:18 AM

## 2022-08-11 ENCOUNTER — Telehealth: Payer: Self-pay

## 2022-08-11 NOTE — Telephone Encounter (Signed)
Refill request received from [facility] for clobetasol 0.05% topical ointment.  VM left for patient as well as a mychart message, to let us know if she needs this prescription.  Dr. Dellis Filbert prescribed Mycolog 08/04/22.

## 2022-08-18 ENCOUNTER — Telehealth: Payer: Self-pay

## 2022-08-18 NOTE — Telephone Encounter (Signed)
RF request received from [facility] (ph [phone]) for clobetasol 0.05% topical ointment.  Last ov 08/04/22 Mycolog ointment was prescribed.  2nd VM left for patient to see if she needed this prescription. Pharmacy was contacted and they will have patient contact our office.

## 2022-08-19 NOTE — Telephone Encounter (Signed)
Patient called and said that she does not need refill on Rx that [facility] requested. She is doing well on the Mycolog that was prescribed.

## 2022-09-19 DIAGNOSIS — J302 Other seasonal allergic rhinitis: Secondary | ICD-10-CM | POA: Insufficient documentation

## 2022-12-03 ENCOUNTER — Encounter: Payer: Self-pay | Admitting: Oncology

## 2022-12-03 ENCOUNTER — Inpatient Hospital Stay: Payer: Medicare HMO | Attending: Oncology

## 2022-12-03 ENCOUNTER — Inpatient Hospital Stay: Payer: Medicare HMO | Admitting: Oncology

## 2022-12-03 VITALS — BP 134/70 | HR 98 | Temp 98.1°F | Resp 18 | Ht 63.0 in | Wt 293.2 lb

## 2022-12-03 DIAGNOSIS — G8929 Other chronic pain: Secondary | ICD-10-CM | POA: Insufficient documentation

## 2022-12-03 DIAGNOSIS — C187 Malignant neoplasm of sigmoid colon: Secondary | ICD-10-CM | POA: Diagnosis not present

## 2022-12-03 DIAGNOSIS — Z9049 Acquired absence of other specified parts of digestive tract: Secondary | ICD-10-CM | POA: Diagnosis not present

## 2022-12-03 DIAGNOSIS — Z85528 Personal history of other malignant neoplasm of kidney: Secondary | ICD-10-CM | POA: Insufficient documentation

## 2022-12-03 DIAGNOSIS — M549 Dorsalgia, unspecified: Secondary | ICD-10-CM | POA: Insufficient documentation

## 2022-12-03 DIAGNOSIS — Z85038 Personal history of other malignant neoplasm of large intestine: Secondary | ICD-10-CM | POA: Insufficient documentation

## 2022-12-03 LAB — CBC WITH DIFFERENTIAL (CANCER CENTER ONLY)
Abs Immature Granulocytes: 0.01 10*3/uL (ref 0.00–0.07)
Basophils Absolute: 0 10*3/uL (ref 0.0–0.1)
Basophils Relative: 1 %
Eosinophils Absolute: 0.3 10*3/uL (ref 0.0–0.5)
Eosinophils Relative: 3 %
HCT: 38.5 % (ref 36.0–46.0)
Hemoglobin: 12.4 g/dL (ref 12.0–15.0)
Immature Granulocytes: 0 %
Lymphocytes Relative: 19 %
Lymphs Abs: 1.5 10*3/uL (ref 0.7–4.0)
MCH: 29 pg (ref 26.0–34.0)
MCHC: 32.2 g/dL (ref 30.0–36.0)
MCV: 90.2 fL (ref 80.0–100.0)
Monocytes Absolute: 0.4 10*3/uL (ref 0.1–1.0)
Monocytes Relative: 5 %
Neutro Abs: 5.4 10*3/uL (ref 1.7–7.7)
Neutrophils Relative %: 72 %
Platelet Count: 143 10*3/uL — ABNORMAL LOW (ref 150–400)
RBC: 4.27 MIL/uL (ref 3.87–5.11)
RDW: 14 % (ref 11.5–15.5)
WBC Count: 7.6 10*3/uL (ref 4.0–10.5)
nRBC: 0 % (ref 0.0–0.2)

## 2022-12-03 LAB — CEA (ACCESS): CEA (CHCC): 1.25 ng/mL (ref 0.00–5.00)

## 2022-12-03 NOTE — Progress Notes (Signed)
  [facility] OFFICE PROGRESS NOTE   Diagnosis: Colon cancer, renal cell carcinoma  INTERVAL HISTORY:   Crystal York returns as scheduled.  She feels well.  No difficulty with bowel function.  Good appetite.  She reports occasional "choking spells "for the past few years.  She has undergone an esophagram and upper endoscopy in the past.  Objective:  Vital signs in last 24 hours:  Blood pressure 134/70, pulse 98, temperature 98.1 F (36.7 C), temperature source Oral, resp. rate 18, height 5\' 3"  (1.6 m), weight 293 lb 3.2 oz (133 kg), last menstrual period 10/01/2010, SpO2 97%.    HEENT: Neck without mass Lymphatics: No cervical, supraclavicular, axillary, or inguinal nodes Resp: Lungs clear bilaterally Cardio: Regular rate and rhythm GI: No hepatosplenomegaly, no mass, nontender Vascular: Trace edema with chronic stasis change at the lower leg bilaterally   Lab Results:  Lab Results  Component Value Date   WBC 7.6 12/03/2022   HGB 12.4 12/03/2022   HCT 38.5 12/03/2022   MCV 90.2 12/03/2022   PLT 143 (L) 12/03/2022   NEUTROABS 5.4 12/03/2022    CMP  Lab Results  Component Value Date   NA 142 10/01/2017   K 4.3 10/01/2017   CL 108 10/01/2017   CO2 29 10/01/2017   GLUCOSE 94 10/01/2017   BUN 12 10/01/2017   CREATININE 1.24 (H) 10/01/2017   CALCIUM 9.1 10/01/2017   PROT 7.0 04/26/2012   ALBUMIN 3.8 04/26/2012   AST 14 04/26/2012   ALT 17 04/26/2012   ALKPHOS 62 04/26/2012   BILITOT 0.4 04/26/2012   GFRNONAA 44 (L) 10/01/2017   GFRAA 51 (L) 10/01/2017    Lab Results  Component Value Date   CEA1 1.20 12/05/2020   CEA 1.36 12/04/2021     Medications: I have reviewed the patient's current medications.   Assessment/Plan: Stage IIIC (T4 ,N2) colon cancer, status post partial colectomy 10/04/2006, followed by 12 cycles of adjuvant chemotherapy. Restaging CT evaluation 07/31/2009 showed no evidence of recurrent colon cancer. Restaging CT evaluation  08/01/2010 showed no evidence of recurrent colon cancer.   CT abdomen/pelvis-renal protocol  10/05/2014-negative for recurrent cancer. Status post colonoscopy by Dr. Elnoria Howard 10/02/2008 with findings of a sessile polyp in the ascending colon and small hemorrhoids. The pathology from the polyp confirmed a tubular adenoma. Negative colonoscopy 11/03/2013  Colonoscopy 03/24/2019-polyps removed from the transverse and ascending colon, tubular adenomas History of anemia. Enlarged prevascular lymph node on CT 11/03/2006, stable on CT scans April 2009, March 2010 and March 2011. History of renal insufficiency. History of an elevated preoperative CEA. CEA was normal on 10/01/2017 Status post left nephrectomy for stage I renal cell carcinoma 10/04/2006.  "Cystic" lesion of the subcutaneous tissue at the right lumbar level. A cyst has been present in this area dating back to CT scans from 2008. This is felt to likely be a benign finding. Chronic back pain-followed by a pain clinic      Disposition: Crystal York is in clinical remission from colon cancer and renal cell carcinoma.  She will continue colonoscopy surveillance with Dr. Elnoria Howard.  She is followed by Dr. Ronne Binning renal cell carcinoma.  She would like to continue follow-up with Cancer center.  Crystal York will return for an office visit in 1 year.  Thornton Papas, MD  12/03/2022  11:46 AM

## 2022-12-04 ENCOUNTER — Telehealth: Payer: Self-pay | Admitting: *Deleted

## 2022-12-04 NOTE — Telephone Encounter (Signed)
Notified patient of normal CEA. F/U as scheduled.

## 2022-12-04 NOTE — Telephone Encounter (Signed)
-----   Message from Thornton Papas sent at 12/03/2022  5:41 PM EDT ----- Please call patient, CEA is normal, follow-up as scheduled

## 2022-12-24 ENCOUNTER — Other Ambulatory Visit (HOSPITAL_COMMUNITY): Payer: Self-pay | Admitting: Family Medicine

## 2022-12-24 DIAGNOSIS — Z1231 Encounter for screening mammogram for malignant neoplasm of breast: Secondary | ICD-10-CM

## 2022-12-31 ENCOUNTER — Ambulatory Visit (HOSPITAL_COMMUNITY)
Admission: RE | Admit: 2022-12-31 | Discharge: 2022-12-31 | Disposition: A | Discharge status: Home / Self Care | Payer: Medicare HMO | Source: Ambulatory Visit | Attending: Family Medicine | Admitting: Family Medicine

## 2022-12-31 DIAGNOSIS — Z1231 Encounter for screening mammogram for malignant neoplasm of breast: Secondary | ICD-10-CM | POA: Diagnosis present

## 2023-01-05 ENCOUNTER — Other Ambulatory Visit (HOSPITAL_COMMUNITY): Payer: Self-pay | Admitting: Family Medicine

## 2023-01-05 DIAGNOSIS — R928 Other abnormal and inconclusive findings on diagnostic imaging of breast: Secondary | ICD-10-CM

## 2023-01-12 ENCOUNTER — Ambulatory Visit (HOSPITAL_COMMUNITY)
Admission: RE | Admit: 2023-01-12 | Discharge: 2023-01-12 | Disposition: A | Discharge status: Home / Self Care | Payer: Medicare HMO | Source: Ambulatory Visit | Attending: Family Medicine | Admitting: Family Medicine

## 2023-01-12 DIAGNOSIS — R928 Other abnormal and inconclusive findings on diagnostic imaging of breast: Secondary | ICD-10-CM | POA: Diagnosis present

## 2023-05-03 DIAGNOSIS — M25561 Pain in right knee: Secondary | ICD-10-CM | POA: Insufficient documentation

## 2023-05-25 ENCOUNTER — Telehealth: Payer: Self-pay | Admitting: *Deleted

## 2023-05-25 DIAGNOSIS — E2839 Other primary ovarian failure: Secondary | ICD-10-CM

## 2023-05-25 NOTE — Telephone Encounter (Signed)
 Patient left message requesting order for BMD to [facility].    Last BMD 08/04/22 -ML BMD 05/26/18 -Normal Next Breast & Pelvic 07/2023 -Dr. Karma Greaser  Order pended  Routing to Dr. Karma Greaser

## 2023-06-07 ENCOUNTER — Ambulatory Visit: Payer: Medicare HMO | Admitting: Urology

## 2023-06-09 ENCOUNTER — Ambulatory Visit: Payer: Medicare HMO | Admitting: Urology

## 2023-07-12 ENCOUNTER — Other Ambulatory Visit: Payer: Self-pay | Admitting: Obstetrics & Gynecology

## 2023-07-12 NOTE — Telephone Encounter (Signed)
 Med refill request: Mycolog ointment  Last AEX: 08/04/22 Next AEX: 08/05/23  Last MMG (if hormonal med) 12/31/22 birads cat 1 incomplete-needed additional imaging. 01/12/23 u/s of breast  Refill authorized: Last rx 08/04/22 #30g with 4 refills. Please approve or deny as appropriate.

## 2023-08-05 ENCOUNTER — Encounter: Payer: Medicare HMO | Admitting: Obstetrics and Gynecology

## 2023-09-08 ENCOUNTER — Encounter: Payer: Self-pay | Admitting: Obstetrics and Gynecology

## 2023-09-08 ENCOUNTER — Ambulatory Visit (INDEPENDENT_AMBULATORY_CARE_PROVIDER_SITE_OTHER): Admitting: Obstetrics and Gynecology

## 2023-09-08 VITALS — BP 114/70 | HR 70 | Ht 63.25 in | Wt 298.0 lb

## 2023-09-08 DIAGNOSIS — Z9289 Personal history of other medical treatment: Secondary | ICD-10-CM

## 2023-09-08 DIAGNOSIS — R87610 Atypical squamous cells of undetermined significance on cytologic smear of cervix (ASC-US): Secondary | ICD-10-CM | POA: Diagnosis not present

## 2023-09-08 DIAGNOSIS — E2839 Other primary ovarian failure: Secondary | ICD-10-CM

## 2023-09-08 DIAGNOSIS — Z6841 Body Mass Index (BMI) 40.0 and over, adult: Secondary | ICD-10-CM | POA: Diagnosis not present

## 2023-09-08 DIAGNOSIS — Z9189 Other specified personal risk factors, not elsewhere classified: Secondary | ICD-10-CM

## 2023-09-08 DIAGNOSIS — Z1211 Encounter for screening for malignant neoplasm of colon: Secondary | ICD-10-CM

## 2023-09-08 DIAGNOSIS — Z01419 Encounter for gynecological examination (general) (routine) without abnormal findings: Secondary | ICD-10-CM

## 2023-09-08 NOTE — Progress Notes (Signed)
 73 y.o. y.o. female here for medicare gyn annual exam. Patient's last menstrual period was 10/01/2010.   G2P2L2 Married   RP:  Established patient presenting for annual gyn exam    HPI: Postmenopausal, well on no HRT.  No PMB.  No pelvic pain.  Pap smear Neg in 05/2021.  ASCUS/Negative high-risk HPV 2018, otherwise all previous Pap smears were normal. No indication for a Pap test today. Breasts normal.  Mammogram Neg 12/31/22  Colonoscopy 2020 repeat in 5 years. Referral placed. DEXA 2020 normal.  Next DEXA recommended at 5 year interval referral placed. Health labs with Fam MD.  BMI 52.2.   Body mass index is 52.37 kg/m.      No data to display          Blood pressure 114/70, pulse 70, height 5' 3.25" (1.607 m), weight 298 lb (135.2 kg), last menstrual period 10/01/2010, SpO2 98%.     Component Value Date/Time   DIAGPAP  06/05/2021 1154    - Negative for intraepithelial lesion or malignancy (NILM)   ADEQPAP  06/05/2021 1154    Satisfactory for evaluation; transformation zone component PRESENT.    GYN HISTORY:    Component Value Date/Time   DIAGPAP  06/05/2021 1154    - Negative for intraepithelial lesion or malignancy (NILM)   ADEQPAP  06/05/2021 1154    Satisfactory for evaluation; transformation zone component PRESENT.    OB History  Gravida Para Term Preterm AB Living  2 2 2   2   SAB IAB Ectopic Multiple Live Births          # Outcome Date GA Lbr Len/2nd Weight Sex Type Anes PTL Lv  2 Term           1 Term             Past Medical History:  Diagnosis Date   Anemia    ASCUS of cervix with negative high risk HPV 04/2017   Chronic back pain    chronic pain "deteriorating"   Colon cancer [facility])    '08- chemotherapy after surgery   Colon cancer (HCC)    H/O seasonal allergies    History of kidney stones    Renal cancer (HCC)    '08 left neprectomy    Past Surgical History:  Procedure Laterality Date   BALLOON DILATION N/A 03/24/2019   Procedure:  BALLOON DILATION;  Surgeon: Alvis Jourdain, MD;  Location: [facility];  Service: Endoscopy;  Laterality: N/A;   BARIATRIC SURGERY     CARPAL TUNNEL RELEASE     CATARACT EXTRACTION, BILATERAL     lens implants   COLON SURGERY     COLONOSCOPY WITH PROPOFOL  N/A 11/03/2013   Procedure: COLONOSCOPY WITH PROPOFOL ;  Surgeon: Almeda Aris, MD;  Location: [facility];  Service: Endoscopy;  Laterality: N/A;   COLONOSCOPY WITH PROPOFOL  N/A 03/24/2019   Procedure: COLONOSCOPY WITH PROPOFOL ;  Surgeon: Alvis Jourdain, MD;  Location: [facility];  Service: Endoscopy;  Laterality: N/A;   COLPOSCOPY     ESOPHAGOGASTRODUODENOSCOPY (EGD) WITH PROPOFOL  N/A 03/24/2019   Procedure: ESOPHAGOGASTRODUODENOSCOPY (EGD) WITH PROPOFOL ;  Surgeon: Alvis Jourdain, MD;  [facility]: [facility];  Service: Endoscopy;  Laterality: N/A;   GASTRIC BYPASS  1982   HAND SURGERY     carpel tunnel surgery both hands   HEMORRHOID SURGERY  09/1969   HEMORRHOID SURGERY     HERNIA REPAIR  09/2006   HYSTEROSCOPY  10/2009   Endometrial polyp   KIDNEY SURGERY  NEPHRECTOMY     POLYPECTOMY  03/24/2019   Procedure: POLYPECTOMY;  Surgeon: Alvis Jourdain, MD;  Location: [facility];  Service: Endoscopy;;   radioactive therapy on back  2013   RADIOLOGY WITH ANESTHESIA N/A 03/28/2015   Procedure: MRI LUMBER SPINE WITHOUT;  Surgeon: Medication Radiologist, MD;  Location: [facility];  Service: Radiology;  Laterality: N/A;   TONSILLECTOMY  09/1969   TUBAL LIGATION      Current Outpatient Medications on File Prior to Visit  Medication Sig Dispense Refill   Camphor-Menthol-Methyl Sal (SALONPAS TD) Place 1 patch onto the skin daily as needed. With Lidocaine      carboxymethylcellulose (REFRESH TEARS) 0.5 % SOLN 1 drop daily.     COLLAGEN PO Take by mouth daily as needed.     diazepam (VALIUM) 5 MG tablet Take 5 mg by mouth daily as needed (vertigo). Reported on 09/17/2015     diclofenac Sodium (VOLTAREN) 1 % GEL Apply to affected joints 4  times a day a needed for arthritis pain     diphenhydrAMINE (BENADRYL) 25 MG tablet Take 25 mg by mouth daily as needed for allergies.     ELDERBERRY PO Take by mouth. Takes 2 gummies     fexofenadine (ALLEGRA) 180 MG tablet Take 180 mg by mouth daily.     IBUPROFEN PO Take by mouth.     Multiple Vitamin (MULTIVITAMIN) tablet Take 1 tablet by mouth daily. Diet plan vitamin     nystatin  (MYCOSTATIN /NYSTOP ) powder Apply 1 Application topically 3 (three) times daily. 15 g 5   nystatin -triamcinolone  ointment (MYCOLOG) APPLY 1 APPLICATION TOPICALLY DAILY AS NEEDED. 30 g 11   triamcinolone  ointment (KENALOG) 0.1 % SMARTSIG:1 Application Topical 2-3 Times Daily     No current facility-administered medications on file prior to visit.    Social History   Socioeconomic History   Marital status: Married    Spouse name: Not on file   Number of children: Not on file   Years of education: Not on file   Highest education level: Not on file  Occupational History   Not on file  Tobacco Use   Smoking status: Former    Current packs/day: 0.00    Average packs/day: 0.3 packs/day for 1.5 years (0.4 ttl pk-yrs)    Types: Cigarettes    Start date: 10/26/1993    Quit date: 04/27/1995    Years since quitting: 28.3   Smokeless tobacco: Never   Tobacco comments:    when smoking, would sometimes go months w/o a cigarette  Vaping Use   Vaping status: Never Used  Substance and Sexual Activity   Alcohol use: No   Drug use: No   Sexual activity: Not Currently    Birth control/protection: Post-menopausal    Comment: 1st intercourse 73 yo-Fewer than 5 partners  Other Topics Concern   Not on file  Social History Narrative   Not on file   Social Drivers of Health   Financial Resource Strain: Not on file  Food Insecurity: Low Risk  (03/30/2023)   Received from [facility]   Hunger Vital Sign    Worried About Running Out of Food in the Last Year: Never true    Ran Out of Food in the Last Year: Never  true  Transportation Needs: No Transportation Needs (03/30/2023)   Received from [employer]    In the past 12 months, has lack of reliable transportation kept you from medical appointments, meetings, work or from getting things needed for  daily living? : No  Physical Activity: Not on file  Stress: Not on file  Social Connections: Not on file  Intimate Partner Violence: Not on file    Family History  Problem Relation Age of Onset   Diabetes Mother    Hypertension Mother    Colon cancer Sister 21   Pancreatic cancer Sister 60   Lung cancer Sister 59       smoker   Lung cancer Other        niece; w/ metastasis; smoker   Colon cancer Other 51       niece w/ colon rupture - pt believes due to cancer   Colon cancer Father 63       infection vs. colon cancer   Heart Problems Father    Rheum arthritis Maternal Grandmother    Other Daughter        endometrial polyps   Cancer Sister        NOS cancer; no further info   Other Brother        NOS blood disorder   Hodgkin's lymphoma Brother 51   Cancer Brother        Lymphoma   Lung cancer Brother    Leukemia Brother    Prostate cancer Other        nephew dx. late 23s   Breast cancer Other        niece dx. 49     Allergies  Allergen Reactions   Hydrocodone  Nausea And Vomiting   Ciprofloxacin Swelling and Rash   Codeine Nausea And Vomiting and Rash   Macrodantin [Nitrofurantoin] Nausea And Vomiting and Rash   Sulfa  Antibiotics Rash      Patient's last menstrual period was Patient's last menstrual period was 10/01/2010.Aaron Aas            Review of Systems Alls systems reviewed and are negative.     Physical Exam Constitutional:      Appearance: Normal appearance.  Genitourinary:     Vulva and urethral meatus normal.     No lesions in the vagina.     Genitourinary Comments: Difficult to evaluate uterus and ovaries due to body habitus. PUS placed     Right Labia: No rash, lesions or skin changes.     Left Labia: No lesions, skin changes or rash.    No vaginal discharge or tenderness.     No vaginal prolapse present.    No vaginal atrophy present.     Right Adnexa: not tender, not palpable and no mass present.    Left Adnexa: not tender, not palpable and no mass present.    No cervical motion tenderness or discharge.     Uterus is not enlarged, tender or irregular.  Breasts:    Right: Normal.     Left: Normal.  HENT:     Head: Normocephalic.  Neck:     Thyroid: No thyroid mass, thyromegaly or thyroid tenderness.  Cardiovascular:     Rate and Rhythm: Normal rate and regular rhythm.     Heart sounds: Normal heart sounds, S1 normal and S2 normal.  Pulmonary:     Effort: Pulmonary effort is normal.     Breath sounds: Normal breath sounds and air entry.  Abdominal:     General: There is no distension.     Palpations: Abdomen is soft. There is no mass.     Tenderness: There is no abdominal tenderness. There is no guarding or rebound.  Musculoskeletal:  General: Normal range of motion.     Cervical back: Full passive range of motion without pain, normal range of motion and neck supple. No tenderness.     Right lower leg: No edema.     Left lower leg: No edema.  Neurological:     Mental Status: She is alert.  Skin:    General: Skin is warm.  Psychiatric:        Mood and Affect: Mood normal.        Behavior: Behavior normal.        Thought Content: Thought content normal.  Vitals and nursing note reviewed. Exam conducted with a chaperone present.    Joy was present for exam today   A:         Well Woman GYN exam, medicare                             P:        Pap smear not indicated Encouraged annual mammogram screening Colon cancer screening referral placed today DXA ordered today Labs and immunizations to do with PMD Discussed breast self exams Encouraged healthy lifestyle practices Encouraged Vit D and Calcium   No follow-ups on file.  Reinaldo Caras

## 2023-09-23 ENCOUNTER — Encounter: Payer: Self-pay | Admitting: Obstetrics and Gynecology

## 2023-09-23 ENCOUNTER — Ambulatory Visit (HOSPITAL_BASED_OUTPATIENT_CLINIC_OR_DEPARTMENT_OTHER)
Admission: RE | Admit: 2023-09-23 | Discharge: 2023-09-23 | Disposition: A | Discharge status: Home / Self Care | Source: Ambulatory Visit | Attending: Obstetrics and Gynecology | Admitting: Obstetrics and Gynecology

## 2023-09-23 DIAGNOSIS — E2839 Other primary ovarian failure: Secondary | ICD-10-CM | POA: Insufficient documentation

## 2023-10-07 ENCOUNTER — Ambulatory Visit (INDEPENDENT_AMBULATORY_CARE_PROVIDER_SITE_OTHER): Admitting: Obstetrics and Gynecology

## 2023-10-07 ENCOUNTER — Ambulatory Visit (INDEPENDENT_AMBULATORY_CARE_PROVIDER_SITE_OTHER)

## 2023-10-07 ENCOUNTER — Encounter: Payer: Self-pay | Admitting: Obstetrics and Gynecology

## 2023-10-07 ENCOUNTER — Other Ambulatory Visit: Payer: Self-pay | Admitting: Obstetrics and Gynecology

## 2023-10-07 VITALS — BP 118/82 | HR 83

## 2023-10-07 DIAGNOSIS — R9389 Abnormal findings on diagnostic imaging of other specified body structures: Secondary | ICD-10-CM

## 2023-10-07 DIAGNOSIS — Z712 Person consulting for explanation of examination or test findings: Secondary | ICD-10-CM | POA: Diagnosis not present

## 2023-10-07 DIAGNOSIS — N840 Polyp of corpus uteri: Secondary | ICD-10-CM

## 2023-10-07 DIAGNOSIS — Z9289 Personal history of other medical treatment: Secondary | ICD-10-CM

## 2023-10-07 DIAGNOSIS — Z85038 Personal history of other malignant neoplasm of large intestine: Secondary | ICD-10-CM | POA: Diagnosis not present

## 2023-10-07 DIAGNOSIS — Z9189 Other specified personal risk factors, not elsewhere classified: Secondary | ICD-10-CM

## 2023-10-07 DIAGNOSIS — Z1211 Encounter for screening for malignant neoplasm of colon: Secondary | ICD-10-CM

## 2023-10-07 DIAGNOSIS — E2839 Other primary ovarian failure: Secondary | ICD-10-CM

## 2023-10-07 DIAGNOSIS — R87610 Atypical squamous cells of undetermined significance on cytologic smear of cervix (ASC-US): Secondary | ICD-10-CM

## 2023-10-07 DIAGNOSIS — Z01419 Encounter for gynecological examination (general) (routine) without abnormal findings: Secondary | ICD-10-CM

## 2023-10-07 NOTE — Progress Notes (Signed)
 73 y.o. y.o. female here for PUS results Patient's last menstrual period was 10/01/2010.   G2P2L2 Married   RP:  Established patient presenting for annual gyn exam    History of colon cancer and newly diagnosed A.fib.  HPI: Postmenopausal, well on no HRT.  No PMB.  No pelvic pain.  Pap smear Neg in 05/2021.  ASCUS/Negative high-risk HPV 2018, otherwise all previous Pap smears were normal. No indication for a Pap test today. Breasts normal.  Mammogram Neg 12/31/22  Colonoscopy 2020 repeat in 5 years. Referral placed. DEXA 2020 normal. 5/25 -1.0 repeat in two years. Health labs with Fam MD.  BMI 52.2.   10/07/23 PUS with EML at 8mm with cystic areas wih 3.39mm avascular echogenic area to do myosure D&C  There is no height or weight on file to calculate BMI.      No data to display          Blood pressure 118/82, pulse 83, last menstrual period 10/01/2010, SpO2 99%.     Component Value Date/Time   DIAGPAP  06/05/2021 1154    - Negative for intraepithelial lesion or malignancy (NILM)   ADEQPAP  06/05/2021 1154    Satisfactory for evaluation; transformation zone component PRESENT.    GYN HISTORY:    Component Value Date/Time   DIAGPAP  06/05/2021 1154    - Negative for intraepithelial lesion or malignancy (NILM)   ADEQPAP  06/05/2021 1154    Satisfactory for evaluation; transformation zone component PRESENT.    OB History  Gravida Para Term Preterm AB Living  2 2 2   2   SAB IAB Ectopic Multiple Live Births          # Outcome Date GA Lbr Len/2nd Weight Sex Type Anes PTL Lv  2 Term           1 Term             Past Medical History:  Diagnosis Date   Anemia    ASCUS of cervix with negative high risk HPV 04/2017   Chronic back pain    chronic pain "deteriorating"   Colon cancer [facility])    '08- chemotherapy after surgery   Colon cancer (HCC)    H/O seasonal allergies    History of kidney stones    Renal cancer (HCC)    '08 left neprectomy    Past Surgical  History:  Procedure Laterality Date   BALLOON DILATION N/A 03/24/2019   Procedure: BALLOON DILATION;  Surgeon: Alvis Jourdain, MD;  [facility];  Service: Endoscopy;  Laterality: N/A;   BARIATRIC SURGERY     CARPAL TUNNEL RELEASE     CATARACT EXTRACTION, BILATERAL     lens implants   COLON SURGERY     COLONOSCOPY WITH PROPOFOL  N/A 11/03/2013   Procedure: COLONOSCOPY WITH PROPOFOL ;  Surgeon: Almeda Aris, MD;  [facility];  Service: Endoscopy;  Laterality: N/A;   COLONOSCOPY WITH PROPOFOL  N/A 03/24/2019   Procedure: COLONOSCOPY WITH PROPOFOL ;  Surgeon: Alvis Jourdain, MD;  [facility];  Service: Endoscopy;  Laterality: N/A;   COLPOSCOPY     ESOPHAGOGASTRODUODENOSCOPY (EGD) WITH PROPOFOL  N/A 03/24/2019   Procedure: ESOPHAGOGASTRODUODENOSCOPY (EGD) WITH PROPOFOL ;  Surgeon: Alvis Jourdain, MD;  [facility];  Service: Endoscopy;  Laterality: N/A;   GASTRIC BYPASS  1982   HAND SURGERY     carpel tunnel surgery both hands   HEMORRHOID SURGERY  09/1969   HEMORRHOID SURGERY     HERNIA REPAIR  09/2006   HYSTEROSCOPY  10/2009   Endometrial polyp   KIDNEY SURGERY     NEPHRECTOMY     POLYPECTOMY  03/24/2019   Procedure: POLYPECTOMY;  Surgeon: Alvis Jourdain, MD;  [facility];  Service: Endoscopy;;   radioactive therapy on back  2013   RADIOLOGY WITH ANESTHESIA N/A 03/28/2015   Procedure: MRI LUMBER SPINE WITHOUT;  Surgeon: Medication Radiologist, MD;  Location: [facility];  Service: Radiology;  Laterality: N/A;   TONSILLECTOMY  09/1969   TUBAL LIGATION      Current Outpatient Medications on File Prior to Visit  Medication Sig Dispense Refill   carboxymethylcellulose (REFRESH TEARS) 0.5 % SOLN 1 drop daily.     diazepam (VALIUM) 5 MG tablet Take 5 mg by mouth daily as needed (vertigo). Reported on 09/17/2015     ELDERBERRY PO Take by mouth. Takes 2 gummies     fexofenadine (ALLEGRA) 180 MG tablet Take 180 mg by mouth daily.     Multiple Vitamin  (MULTIVITAMIN) tablet Take 1 tablet by mouth daily. Diet plan vitamin     NIACIN PO Take by mouth.     nystatin -triamcinolone  ointment (MYCOLOG) APPLY 1 APPLICATION TOPICALLY DAILY AS NEEDED. 30 g 11   triamcinolone  ointment (KENALOG) 0.1 % SMARTSIG:1 Application Topical 2-3 Times Daily     ELIQUIS 5 MG TABS tablet Take 5 mg by mouth 2 (two) times daily. (Patient not taking: Reported on 10/07/2023)     metoprolol succinate (TOPROL-XL) 25 MG 24 hr tablet Take 12.5 mg by mouth daily. (Patient not taking: Reported on 10/07/2023)     No current facility-administered medications on file prior to visit.    Social History   Socioeconomic History   Marital status: Married    Spouse name: Not on file   Number of children: Not on file   Years of education: Not on file   Highest education level: Not on file  Occupational History   Not on file  Tobacco Use   Smoking status: Former    Current packs/day: 0.00    Average packs/day: 0.3 packs/day for 1.5 years (0.4 ttl pk-yrs)    Types: Cigarettes    Start date: 10/26/1993    Quit date: 04/27/1995    Years since quitting: 28.4   Smokeless tobacco: Never   Tobacco comments:    when smoking, would sometimes go months w/o a cigarette  Vaping Use   Vaping status: Never Used  Substance and Sexual Activity   Alcohol use: No   Drug use: No   Sexual activity: Not Currently    Birth control/protection: Post-menopausal    Comment: 1st intercourse 73 yo-Fewer than 5 partners  Other Topics Concern   Not on file  Social History Narrative   Not on file   Social Drivers of Health   Financial Resource Strain: Not on file  Food Insecurity: Low Risk  (10/01/2023)   Received from [facility]   Hunger Vital Sign    Worried About Running Out of Food in the Last Year: Never true    Ran Out of Food in the Last Year: Never true  Transportation Needs: No Transportation Needs (10/01/2023)   Received from [facility]    In the past 12  months, has lack of reliable transportation kept you from medical appointments, meetings, work or from getting things needed for daily living? : No  Physical Activity: Not on file  Stress: Not on file  Social Connections: Not on file  Intimate  Partner Violence: Not on file    Family History  Problem Relation Age of Onset   Diabetes Mother    Hypertension Mother    Colon cancer Sister 48   Pancreatic cancer Sister 81   Lung cancer Sister 42       smoker   Lung cancer Other        niece; w/ metastasis; smoker   Colon cancer Other 79       niece w/ colon rupture - pt believes due to cancer   Colon cancer Father 24       infection vs. colon cancer   Heart Problems Father    Rheum arthritis Maternal Grandmother    Other Daughter        endometrial polyps   Cancer Sister        NOS cancer; no further info   Other Brother        NOS blood disorder   Hodgkin's lymphoma Brother 36   Cancer Brother        Lymphoma   Lung cancer Brother    Leukemia Brother    Prostate cancer Other        nephew dx. late 3s   Breast cancer Other        niece dx. 49     Allergies  Allergen Reactions   Hydrocodone  Nausea And Vomiting   Ciprofloxacin Swelling and Rash   Codeine Nausea And Vomiting and Rash   Macrodantin [Nitrofurantoin] Nausea And Vomiting and Rash   Sulfa  Antibiotics Rash      Patient's last menstrual period was Patient's last menstrual period was 10/01/2010.Crystal York            Review of Systems Alls systems reviewed and are negative.         A:       PUS results, thickened cystic endometrial lining, h/o colon cancer                             P:          US  with possible polyp. Discussed polyps can cause bleeding and have associated risk for cancer <1% risk.  Discussed best way to remove these is with the hysteroscopy myosure D&C. This is done with anesthesia in the operating room.  Procedure was reviewed in detail. To send PA for scheduling.  Patient agreed.  20  minutes spent on reviewing records, imaging,  and one on one patient time and counseling patient and documentation Dr. Tia Flowers   No follow-ups on file.  Crystal York

## 2023-10-07 NOTE — H&P (View-Only) (Signed)
 73 y.o. y.o. female here for PUS results Patient's last menstrual period was 10/01/2010.   G2P2L2 Married   RP:  Established patient presenting for annual gyn exam    History of colon cancer and newly diagnosed A.fib.  HPI: Postmenopausal, well on no HRT.  No PMB.  No pelvic pain.  Pap smear Neg in 05/2021.  ASCUS/Negative high-risk HPV 2018, otherwise all previous Pap smears were normal. No indication for a Pap test today. Breasts normal.  Mammogram Neg 12/31/22  Colonoscopy 2020 repeat in 5 years. Referral placed. DEXA 2020 normal. 5/25 -1.0 repeat in two years. Health labs with Fam MD.  BMI 52.2.   10/07/23 PUS with EML at 8mm with cystic areas wih 3.39mm avascular echogenic area to do myosure D&C  There is no height or weight on file to calculate BMI.      No data to display          Blood pressure 118/82, pulse 83, last menstrual period 10/01/2010, SpO2 99%.     Component Value Date/Time   DIAGPAP  06/05/2021 1154    - Negative for intraepithelial lesion or malignancy (NILM)   ADEQPAP  06/05/2021 1154    Satisfactory for evaluation; transformation zone component PRESENT.    GYN HISTORY:    Component Value Date/Time   DIAGPAP  06/05/2021 1154    - Negative for intraepithelial lesion or malignancy (NILM)   ADEQPAP  06/05/2021 1154    Satisfactory for evaluation; transformation zone component PRESENT.    OB History  Gravida Para Term Preterm AB Living  2 2 2   2   SAB IAB Ectopic Multiple Live Births          # Outcome Date GA Lbr Len/2nd Weight Sex Type Anes PTL Lv  2 Term           1 Term             Past Medical History:  Diagnosis Date   Anemia    ASCUS of cervix with negative high risk HPV 04/2017   Chronic back pain    chronic pain "deteriorating"   Colon cancer [facility])    '08- chemotherapy after surgery   Colon cancer (HCC)    H/O seasonal allergies    History of kidney stones    Renal cancer (HCC)    '08 left neprectomy    Past Surgical  History:  Procedure Laterality Date   BALLOON DILATION N/A 03/24/2019   Procedure: BALLOON DILATION;  Surgeon: Alvis Jourdain, MD;  Location: [facility];  Service: Endoscopy;  Laterality: N/A;   BARIATRIC SURGERY     CARPAL TUNNEL RELEASE     CATARACT EXTRACTION, BILATERAL     lens implants   COLON SURGERY     COLONOSCOPY WITH PROPOFOL  N/A 11/03/2013   Procedure: COLONOSCOPY WITH PROPOFOL ;  Surgeon: Almeda Aris, MD;  Location: [facility];  Service: Endoscopy;  Laterality: N/A;   COLONOSCOPY WITH PROPOFOL  N/A 03/24/2019   Procedure: COLONOSCOPY WITH PROPOFOL ;  Surgeon: Alvis Jourdain, MD;  Location: [facility];  Service: Endoscopy;  Laterality: N/A;   COLPOSCOPY     ESOPHAGOGASTRODUODENOSCOPY (EGD) WITH PROPOFOL  N/A 03/24/2019   Procedure: ESOPHAGOGASTRODUODENOSCOPY (EGD) WITH PROPOFOL ;  Surgeon: Alvis Jourdain, MD;  Location: [facility];  Service: Endoscopy;  Laterality: N/A;   GASTRIC BYPASS  1982   HAND SURGERY     carpel tunnel surgery both hands   HEMORRHOID SURGERY  09/1969   HEMORRHOID SURGERY     HERNIA REPAIR  09/2006   HYSTEROSCOPY  10/2009   Endometrial polyp   KIDNEY SURGERY     NEPHRECTOMY     POLYPECTOMY  03/24/2019   Procedure: POLYPECTOMY;  Surgeon: Alvis Jourdain, MD;  Location: [facility];  Service: Endoscopy;;   radioactive therapy on back  2013   RADIOLOGY WITH ANESTHESIA N/A 03/28/2015   Procedure: MRI LUMBER SPINE WITHOUT;  Surgeon: Medication Radiologist, MD;  Location: [facility] OR;  Service: Radiology;  Laterality: N/A;   TONSILLECTOMY  09/1969   TUBAL LIGATION      Current Outpatient Medications on File Prior to Visit  Medication Sig Dispense Refill   carboxymethylcellulose (REFRESH TEARS) 0.5 % SOLN 1 drop daily.     diazepam (VALIUM) 5 MG tablet Take 5 mg by mouth daily as needed (vertigo). Reported on 09/17/2015     ELDERBERRY PO Take by mouth. Takes 2 gummies     fexofenadine (ALLEGRA) 180 MG tablet Take 180 mg by mouth daily.     Multiple Vitamin  (MULTIVITAMIN) tablet Take 1 tablet by mouth daily. Diet plan vitamin     NIACIN PO Take by mouth.     nystatin -triamcinolone  ointment (MYCOLOG) APPLY 1 APPLICATION TOPICALLY DAILY AS NEEDED. 30 g 11   triamcinolone  ointment (KENALOG) 0.1 % SMARTSIG:1 Application Topical 2-3 Times Daily     ELIQUIS 5 MG TABS tablet Take 5 mg by mouth 2 (two) times daily. (Patient not taking: Reported on 10/07/2023)     metoprolol succinate (TOPROL-XL) 25 MG 24 hr tablet Take 12.5 mg by mouth daily. (Patient not taking: Reported on 10/07/2023)     No current facility-administered medications on file prior to visit.    Social History   Socioeconomic History   Marital status: Married    Spouse name: Not on file   Number of children: Not on file   Years of education: Not on file   Highest education level: Not on file  Occupational History   Not on file  Tobacco Use   Smoking status: Former    Current packs/day: 0.00    Average packs/day: 0.3 packs/day for 1.5 years (0.4 ttl pk-yrs)    Types: Cigarettes    Start date: 10/26/1993    Quit date: 04/27/1995    Years since quitting: 28.4   Smokeless tobacco: Never   Tobacco comments:    when smoking, would sometimes go months w/o a cigarette  Vaping Use   Vaping status: Never Used  Substance and Sexual Activity   Alcohol use: No   Drug use: No   Sexual activity: Not Currently    Birth control/protection: Post-menopausal    Comment: 1st intercourse 73 yo-Fewer than 5 partners  Other Topics Concern   Not on file  Social History Narrative   Not on file   Social Drivers of Health   Financial Resource Strain: Not on file  Food Insecurity: Low Risk  (10/01/2023)   Received from [facility]   Hunger Vital Sign    Worried About Running Out of Food in the Last Year: Never true    Ran Out of Food in the Last Year: Never true  Transportation Needs: No Transportation Needs (10/01/2023)   Received from [facility]    In the past 12  months, has lack of reliable transportation kept you from medical appointments, meetings, work or from getting things needed for daily living? : No  Physical Activity: Not on file  Stress: Not on file  Social Connections: Not on file  Intimate  Partner Violence: Not on file    Family History  Problem Relation Age of Onset   Diabetes Mother    Hypertension Mother    Colon cancer Sister 48   Pancreatic cancer Sister 81   Lung cancer Sister 42       smoker   Lung cancer Other        niece; w/ metastasis; smoker   Colon cancer Other 79       niece w/ colon rupture - pt believes due to cancer   Colon cancer Father 24       infection vs. colon cancer   Heart Problems Father    Rheum arthritis Maternal Grandmother    Other Daughter        endometrial polyps   Cancer Sister        NOS cancer; no further info   Other Brother        NOS blood disorder   Hodgkin's lymphoma Brother 36   Cancer Brother        Lymphoma   Lung cancer Brother    Leukemia Brother    Prostate cancer Other        nephew dx. late 3s   Breast cancer Other        niece dx. 49     Allergies  Allergen Reactions   Hydrocodone  Nausea And Vomiting   Ciprofloxacin Swelling and Rash   Codeine Nausea And Vomiting and Rash   Macrodantin [Nitrofurantoin] Nausea And Vomiting and Rash   Sulfa  Antibiotics Rash      Patient's last menstrual period was Patient's last menstrual period was 10/01/2010.Aaron Aas            Review of Systems Alls systems reviewed and are negative.         A:       PUS results, thickened cystic endometrial lining, h/o colon cancer                             P:          US  with possible polyp. Discussed polyps can cause bleeding and have associated risk for cancer <1% risk.  Discussed best way to remove these is with the hysteroscopy myosure D&C. This is done with anesthesia in the operating room.  Procedure was reviewed in detail. To send PA for scheduling.  Patient agreed.  20  minutes spent on reviewing records, imaging,  and one on one patient time and counseling patient and documentation Dr. Tia Flowers   No follow-ups on file.  Reinaldo Caras

## 2023-10-07 NOTE — Patient Instructions (Signed)
 Hi Crystal York, I did see the bone scan and replied in mychart.  Just need repeat in two years!  I will see you soon. Aleda Hurl will call for scheduling the hysteroscopy myosure D&C.  Hysteroscopy Hysteroscopy is a procedure used to look inside a person's uterus. It should be done right after your period. You may have this procedure if your health care provider wants to: Look for tumors and other growths in the uterus. Know more about your bleeding, fibroids, tumors, polyps, scar tissue, or cancer in the uterus. Know why you aren't able to get pregnant, or why you lose your pregnancies. Find an intrauterine device (IUD) in your body. Put a birth control device in the fallopian tubes. Tell a health care provider about: Any allergies you have. All medicines you are taking. These include vitamins, herbs, eye drops, creams, and over-the-counter medicines. Any problems you or family members have had with anesthesia. Any bleeding problems you have. Any medical conditions or surgeries you've had. Whether you're pregnant or may be pregnant. Whether you have or have had a sexually transmitted infection (STI), or you think you have an STI. What are the risks? Your provider will talk with you about risks. These may include: Bleeding that's worse. Infection. Damage to parts of the uterus or other organs. Allergies to medicines or fluids that are used in the procedure. What happens before the procedure? When to stop eating and drinking Follow instructions from your provider about what you may eat and drink. These may include: 8 hours before your procedure Stop eating most foods. Do not eat meat, fried foods, or fatty foods. Eat only light foods, such as toast or crackers. All liquids are okay except energy drinks and alcohol. 6 hours before your procedure Stop eating. Drink only clear liquids, such as water, clear fruit juice, black coffee, plain tea, and sports drinks. Do not drink energy drinks or  alcohol. 2 hours before your procedure Stop drinking all liquids. You may be allowed to take medicines with small sips of water. If you do not follow your provider's instructions, your procedure may be delayed or canceled. Medicines Ask your provider about: Changing or stopping your regular medicines. These include any diabetes medicines or blood thinners you take. Taking medicines such as aspirin and ibuprofen. These medicines can thin your blood. Do not take them unless your provider tells you to. Taking over-the-counter medicines, vitamins, herbs, and supplements. Medicine may be placed in your cervix the day before the procedure. This medicine causes the cervix to open (dilate). The larger opening makes it easier for the hysteroscope to be inserted into the uterus. General instructions Ask your provider what steps will be taken to help prevent infection. These steps may include: Washing skin with a soap that kills germs. Taking antibiotic medicine. Do not smoke, vape, or use any products that have nicotine or tobacco for at least 4 weeks before the surgery. If you need help quitting, ask your provider. If you will be going home right after the procedure, plan to have an adult: Take you home from the hospital or clinic. You will not be allowed to drive. Care for you for the time you are told. Pee before the procedure begins. What happens during the procedure? You may be given: A sedative. This helps you relax. Anesthesia. This keeps you from feeling pain. It will make you fall asleep for surgery. A small tube with a light and camera called a hysteroscope will be put into your uterus. The camera  sends images to a screen in the room so your provider can see the inside of your uterus. Air or fluid will be used to enlarge your uterus to allow your provider to see it better. In some cases, tissue may be gently scraped from inside the uterus and sent to a lab for testing (biopsy). The  procedure may vary among providers and hospitals. What happens after the procedure? Your blood pressure, heart rate, breathing rate, and blood oxygen level will be monitored until you leave the hospital or clinic. You may have cramps. You may be given medicines for this. You may have bleeding. Bleeding may be light or heavy. This is normal. If you had a biopsy, it is up to you to get the results. Ask your provider, or the department that is doing the procedure, when your results will be ready. This information is not intended to replace advice given to you by your health care provider. Make sure you discuss any questions you have with your health care provider. Document Revised: 09/04/2022 Document Reviewed: 09/04/2022 Elsevier Patient Education  2024 [employer].

## 2023-10-14 ENCOUNTER — Encounter (HOSPITAL_COMMUNITY): Payer: Self-pay | Admitting: Obstetrics and Gynecology

## 2023-10-14 NOTE — Progress Notes (Signed)
 Spoke w/ via phone for pre-op interview--- Adriana Hopping Lab needs dos----  EKG and BMP per anesthesia       Lab results------ COVID test -----patient states asymptomatic no test needed Arrive at -------1330 NPO after MN NO Solid Food.  Clear liquids from MN until---1230 Pre-Surgery Ensure or G2:  Med rec completed Medications to take morning of surgery -----NONE Diabetic medication -----  GLP1 agonist last dose: GLP1 instructions:  Patient instructed no nail polish to be worn day of surgery Patient instructed to bring photo id and insurance card day of surgery Patient aware to have Driver (ride ) / caregiver    for 24 hours after surgery - Daughter Russella Courts Patient Special Instructions ----- shower with antibacterial soap. Pre-Op special Instructions -----  Patient verbalized understanding of instructions that were given at this phone interview. Patient denies chest pain, sob, fever, cough at the interview.

## 2023-10-15 ENCOUNTER — Encounter: Payer: Self-pay | Admitting: *Deleted

## 2023-10-18 NOTE — Progress Notes (Signed)
 Called and spoke w/ patient via phone to inform her of new start time of her surgery scheduled on 10-22-2023 by Dr Tia Flowers at 1200 and to arrive at 1000.  Pt verbalized understanding to arrive at 1000 and be npo after midnight with exception clear liquids until 0900 then nothing by mouth.

## 2023-10-22 ENCOUNTER — Ambulatory Visit (HOSPITAL_COMMUNITY): Admitting: Anesthesiology

## 2023-10-22 ENCOUNTER — Other Ambulatory Visit: Payer: Self-pay

## 2023-10-22 ENCOUNTER — Ambulatory Visit (HOSPITAL_COMMUNITY)
Admission: RE | Admit: 2023-10-22 | Discharge: 2023-10-22 | Disposition: A | Discharge status: Home / Self Care | Attending: Obstetrics and Gynecology | Admitting: Obstetrics and Gynecology

## 2023-10-22 ENCOUNTER — Encounter (HOSPITAL_COMMUNITY): Payer: Self-pay | Admitting: Obstetrics and Gynecology

## 2023-10-22 ENCOUNTER — Encounter (HOSPITAL_COMMUNITY)
Admission: RE | Disposition: A | Discharge status: Home / Self Care | Payer: Self-pay | Source: Home / Self Care | Attending: Obstetrics and Gynecology

## 2023-10-22 DIAGNOSIS — E66813 Obesity, class 3: Secondary | ICD-10-CM | POA: Diagnosis not present

## 2023-10-22 DIAGNOSIS — Z6841 Body Mass Index (BMI) 40.0 and over, adult: Secondary | ICD-10-CM

## 2023-10-22 DIAGNOSIS — N840 Polyp of corpus uteri: Secondary | ICD-10-CM

## 2023-10-22 DIAGNOSIS — L821 Other seborrheic keratosis: Secondary | ICD-10-CM | POA: Insufficient documentation

## 2023-10-22 DIAGNOSIS — Z8249 Family history of ischemic heart disease and other diseases of the circulatory system: Secondary | ICD-10-CM | POA: Insufficient documentation

## 2023-10-22 DIAGNOSIS — Z87891 Personal history of nicotine dependence: Secondary | ICD-10-CM | POA: Insufficient documentation

## 2023-10-22 DIAGNOSIS — R9389 Abnormal findings on diagnostic imaging of other specified body structures: Secondary | ICD-10-CM | POA: Insufficient documentation

## 2023-10-22 DIAGNOSIS — Z85038 Personal history of other malignant neoplasm of large intestine: Secondary | ICD-10-CM | POA: Diagnosis present

## 2023-10-22 DIAGNOSIS — I4891 Unspecified atrial fibrillation: Secondary | ICD-10-CM | POA: Insufficient documentation

## 2023-10-22 DIAGNOSIS — Z01818 Encounter for other preprocedural examination: Secondary | ICD-10-CM

## 2023-10-22 DIAGNOSIS — D2272 Melanocytic nevi of left lower limb, including hip: Secondary | ICD-10-CM | POA: Diagnosis not present

## 2023-10-22 HISTORY — PX: MYOSURE RESECTION: SHX7611

## 2023-10-22 HISTORY — PX: DILATATION & CURRETTAGE/HYSTEROSCOPY WITH RESECTOCOPE: SHX5572

## 2023-10-22 HISTORY — DX: Cardiac arrhythmia, unspecified: I49.9

## 2023-10-22 LAB — CBC
HCT: 38.7 % (ref 36.0–46.0)
Hemoglobin: 12.6 g/dL (ref 12.0–15.0)
MCH: 29.8 pg (ref 26.0–34.0)
MCHC: 32.6 g/dL (ref 30.0–36.0)
MCV: 91.5 fL (ref 80.0–100.0)
Platelets: 127 10*3/uL — ABNORMAL LOW (ref 150–400)
RBC: 4.23 MIL/uL (ref 3.87–5.11)
RDW: 13.6 % (ref 11.5–15.5)
WBC: 6.6 10*3/uL (ref 4.0–10.5)
nRBC: 0 % (ref 0.0–0.2)

## 2023-10-22 LAB — BASIC METABOLIC PANEL WITH GFR
Anion gap: 10 (ref 5–15)
BUN: 17 mg/dL (ref 8–23)
CO2: 21 mmol/L — ABNORMAL LOW (ref 22–32)
Calcium: 9.1 mg/dL (ref 8.9–10.3)
Chloride: 108 mmol/L (ref 98–111)
Creatinine, Ser: 1.04 mg/dL — ABNORMAL HIGH (ref 0.44–1.00)
GFR, Estimated: 57 mL/min — ABNORMAL LOW (ref >60)
Glucose, Bld: 91 mg/dL (ref 70–99)
Potassium: 4 mmol/L (ref 3.5–5.1)
Sodium: 139 mmol/L (ref 135–145)

## 2023-10-22 LAB — TYPE AND SCREEN
ABO/RH(D): B NEG
Antibody Screen: NEGATIVE

## 2023-10-22 SURGERY — DILATATION & CURETTAGE/HYSTEROSCOPY WITH RESECTOCOPE
Anesthesia: General

## 2023-10-22 MED ORDER — MONSELS FERRIC SUBSULFATE EX SOLN
CUTANEOUS | Status: AC
  Filled 2023-10-22: qty 8

## 2023-10-22 MED ORDER — FENTANYL CITRATE (PF) 250 MCG/5ML IJ SOLN
INTRAMUSCULAR | Status: DC | PRN
  Administered 2023-10-22 (×2): 50 ug via INTRAVENOUS

## 2023-10-22 MED ORDER — POVIDONE-IODINE 10 % EX SWAB: 2.0000 | Freq: Once | CUTANEOUS | Status: DC

## 2023-10-22 MED ORDER — PROPOFOL 10 MG/ML IV BOLUS
INTRAVENOUS | Status: DC | PRN
  Administered 2023-10-22: 200 mg via INTRAVENOUS

## 2023-10-22 MED ORDER — AMISULPRIDE (ANTIEMETIC) 5 MG/2ML IV SOLN
INTRAVENOUS | Status: AC
  Filled 2023-10-22: qty 4

## 2023-10-22 MED ORDER — SODIUM CHLORIDE 0.9 % IV SOLN: INTRAVENOUS | Status: DC

## 2023-10-22 MED ORDER — BUPIVACAINE HCL (PF) 0.25 % IJ SOLN
INTRAMUSCULAR | Status: AC
  Filled 2023-10-22: qty 30

## 2023-10-22 MED ORDER — MIDAZOLAM HCL 2 MG/2ML IJ SOLN
INTRAMUSCULAR | Status: DC | PRN
  Administered 2023-10-22: 2 mg via INTRAVENOUS

## 2023-10-22 MED ORDER — LACTATED RINGERS IV SOLN: INTRAVENOUS | Status: DC

## 2023-10-22 MED ORDER — FENTANYL CITRATE (PF) 250 MCG/5ML IJ SOLN
INTRAMUSCULAR | Status: AC
  Filled 2023-10-22: qty 5

## 2023-10-22 MED ORDER — EPHEDRINE SULFATE-NACL 50-0.9 MG/10ML-% IV SOSY
PREFILLED_SYRINGE | INTRAVENOUS | Status: DC | PRN
  Administered 2023-10-22: 5 mg via INTRAVENOUS

## 2023-10-22 MED ORDER — BUPIVACAINE HCL 0.25 % IJ SOLN
INTRAMUSCULAR | Status: DC | PRN
  Administered 2023-10-22: 9 mL

## 2023-10-22 MED ORDER — HYDROMORPHONE HCL 1 MG/ML IJ SOLN
0.2500 mg | INTRAMUSCULAR | Status: DC | PRN
  Administered 2023-10-22: 0.25 mg via INTRAVENOUS
  Administered 2023-10-22: 0.5 mg via INTRAVENOUS
  Administered 2023-10-22: 0.25 mg via INTRAVENOUS

## 2023-10-22 MED ORDER — HYDROMORPHONE HCL 1 MG/ML IJ SOLN
INTRAMUSCULAR | Status: AC
Start: 2023-10-22 — End: ?
  Filled 2023-10-22: qty 1

## 2023-10-22 MED ORDER — SODIUM CHLORIDE 0.9 % IR SOLN
Status: DC | PRN
  Administered 2023-10-22: 3000 mL

## 2023-10-22 MED ORDER — ORAL CARE MOUTH RINSE: 15.0000 mL | Freq: Once | OROMUCOSAL | Status: AC

## 2023-10-22 MED ORDER — OXYCODONE HCL 5 MG PO TABS: 5.0000 mg | ORAL_TABLET | Freq: Once | ORAL | Status: DC | PRN

## 2023-10-22 MED ORDER — PHENYLEPHRINE 80 MCG/ML (10ML) SYRINGE FOR IV PUSH (FOR BLOOD PRESSURE SUPPORT)
PREFILLED_SYRINGE | INTRAVENOUS | Status: DC | PRN
  Administered 2023-10-22 (×3): 80 ug via INTRAVENOUS

## 2023-10-22 MED ORDER — CHLORHEXIDINE GLUCONATE 0.12 % MT SOLN
OROMUCOSAL | Status: DC
Start: 2023-10-22 — End: 2023-10-22
  Filled 2023-10-22: qty 15

## 2023-10-22 MED ORDER — OXYCODONE HCL 5 MG/5ML PO SOLN: 5.0000 mg | Freq: Once | ORAL | Status: DC | PRN

## 2023-10-22 MED ORDER — PROPOFOL 10 MG/ML IV BOLUS
INTRAVENOUS | Status: AC
  Filled 2023-10-22: qty 20

## 2023-10-22 MED ORDER — AMISULPRIDE (ANTIEMETIC) 5 MG/2ML IV SOLN
10.0000 mg | Freq: Once | INTRAVENOUS | Status: AC | PRN
Start: 2023-10-22 — End: 2023-10-22
  Administered 2023-10-22: 10 mg via INTRAVENOUS

## 2023-10-22 MED ORDER — LIDOCAINE 2% (20 MG/ML) 5 ML SYRINGE
INTRAMUSCULAR | Status: DC | PRN
  Administered 2023-10-22: 100 mg via INTRAVENOUS

## 2023-10-22 MED ORDER — ONDANSETRON HCL 4 MG/2ML IJ SOLN
4.0000 mg | Freq: Once | INTRAMUSCULAR | Status: AC | PRN
Start: 2023-10-22 — End: 2023-10-22
  Administered 2023-10-22: 4 mg via INTRAVENOUS

## 2023-10-22 MED ORDER — MIDAZOLAM HCL 2 MG/2ML IJ SOLN
INTRAMUSCULAR | Status: AC
  Filled 2023-10-22: qty 2

## 2023-10-22 MED ORDER — ACETAMINOPHEN 500 MG PO TABS
ORAL_TABLET | ORAL | Status: DC
Start: 2023-10-22 — End: 2023-10-22
  Filled 2023-10-22: qty 2

## 2023-10-22 MED ORDER — SODIUM CHLORIDE 0.9 % IV SOLN: INTRAVENOUS | Status: DC | PRN

## 2023-10-22 MED ORDER — KETOROLAC TROMETHAMINE 30 MG/ML IJ SOLN
15.0000 mg | Freq: Once | INTRAMUSCULAR | Status: DC | PRN
Start: 2023-10-22 — End: 2023-10-22

## 2023-10-22 MED ORDER — ACETAMINOPHEN 500 MG PO TABS
1000.0000 mg | ORAL_TABLET | ORAL | Status: AC
  Administered 2023-10-22: 1000 mg via ORAL

## 2023-10-22 MED ORDER — DEXAMETHASONE SODIUM PHOSPHATE 10 MG/ML IJ SOLN
INTRAMUSCULAR | Status: DC | PRN
  Administered 2023-10-22: 10 mg via INTRAVENOUS

## 2023-10-22 MED ORDER — CHLORHEXIDINE GLUCONATE 0.12 % MT SOLN
15.0000 mL | Freq: Once | OROMUCOSAL | Status: AC
  Administered 2023-10-22: 15 mL via OROMUCOSAL

## 2023-10-22 SURGICAL SUPPLY — 22 items
CATH ROBINSON RED A/P 16FR (CATHETERS) IMPLANT
COVER MAYO STAND STRL (DRAPES) ×2 IMPLANT
DEVICE MYOSURE LITE (MISCELLANEOUS) IMPLANT
DEVICE MYOSURE REACH (MISCELLANEOUS) IMPLANT
DILATOR CANAL MILEX (MISCELLANEOUS) IMPLANT
DRSG TEGADERM 2-3/8X2-3/4 SM (GAUZE/BANDAGES/DRESSINGS) IMPLANT
GAUZE SPONGE 2X2 STRL 8-PLY (GAUZE/BANDAGES/DRESSINGS) IMPLANT
GLOVE NEODERM STER SZ 7 (GLOVE) ×2 IMPLANT
GLOVE SURG UNDER POLY LF SZ7 (GLOVE) ×2 IMPLANT
GOWN STRL REUS W/ TWL XL LVL3 (GOWN DISPOSABLE) ×2 IMPLANT
KIT PROCEDURE FLUENT (KITS) ×2 IMPLANT
KIT TURNOVER KIT B (KITS) ×2 IMPLANT
NDL SPNL 22GX3.5 QUINCKE BK (NEEDLE) IMPLANT
NEEDLE SPNL 22GX3.5 QUINCKE BK (NEEDLE) ×2 IMPLANT
PACK VAGINAL MINOR WOMEN LF (CUSTOM PROCEDURE TRAY) ×2 IMPLANT
PAD OB MATERNITY 11 LF (PERSONAL CARE ITEMS) ×2 IMPLANT
PUNCH BIOPSY DERMAL 6MM STRL (MISCELLANEOUS) IMPLANT
SCOPETTES 8 STERILE (MISCELLANEOUS) ×2 IMPLANT
SEAL ROD LENS SCOPE MYOSURE (ABLATOR) ×2 IMPLANT
SUT VIC AB 4-0 PS2 18 (SUTURE) IMPLANT
TOWEL GREEN STERILE FF (TOWEL DISPOSABLE) ×2 IMPLANT
UNDERPAD 30X36 HEAVY ABSORB (UNDERPADS AND DIAPERS) ×2 IMPLANT

## 2023-10-22 NOTE — Anesthesia Procedure Notes (Signed)
 Procedure Name: LMA Insertion Date/Time: 10/22/2023 3:03 PM  Performed by: Ralston Burkes, CRNAPre-anesthesia Checklist: Patient identified, Emergency Drugs available, Suction available, Patient being monitored and Timeout performed Patient Re-evaluated:Patient Re-evaluated prior to induction Oxygen Delivery Method: Circle system utilized Preoxygenation: Pre-oxygenation with 100% oxygen Induction Type: IV induction Ventilation: Mask ventilation without difficulty LMA: LMA inserted LMA Size: 4.0 Number of attempts: 1 Dental Injury: Teeth and Oropharynx as per pre-operative assessment

## 2023-10-22 NOTE — Discharge Instructions (Signed)

## 2023-10-22 NOTE — Transfer of Care (Signed)
 Immediate Anesthesia Transfer of Care Note  Patient: Crystal York  Procedure(s) Performed: DILATATION & CURETTAGE/HYSTEROSCOPY, punh biopsy inner thigh (Left) MYOSURE RESECTION  Patient Location: PACU  Anesthesia Type:General  Level of Consciousness: awake  Airway & Oxygen Therapy: Patient Spontanous Breathing  Post-op Assessment: Report given to RN  Post vital signs: stable  Last Vitals:  Vitals Value Taken Time  BP 133/62 10/22/23 1541  Temp    Pulse 52 10/22/23 1542  Resp 15 10/22/23 1542  SpO2 93 % 10/22/23 1542  Vitals shown include unfiled device data.  Last Pain:  Vitals:   10/22/23 1049  TempSrc: Oral  PainSc: 0-No pain      Patients Stated Pain Goal: 10 (10/22/23 1049)  Complications: No notable events documented.

## 2023-10-22 NOTE — Op Note (Signed)
 10/22/2023 Vonnie Gubler [id number]    OPERATIVE REPORT  Preop Diagnosis: thickened endometrial lining, possible endometrial polyp seen on ultrasound  Post operative diagnosis: same and abnormal inner nevus on left inner thigh  Procedure: Myosure hysteroscopy Dilation and curettage, endocervical curettage, punchy biopsy of atypical nevus  Surgeon: Dr. Sharol Decamp Assistant: none  Fluids: please see anesthesia report Fluid deficit: 60cc  Complications: None Anesthesia: MAC   Findings: Small appearing cervix and uterine cavity measuring 7cm with both ostia seen.  A likely small endometrial polyp was seen and removed. Endometrium appeared cystic.  No submucosal fibroids were seen.  Estimated blood loss: Minimal  Specimens: Endometrial curettings, ECC, atypical mole  Disposition of specimen: Pathology    Procedure: Patient was taken to the OR where she was placed in dorsal lithotomy in Allen stirrups. SCDs were in place.  The patient was prepped and draped in the usual sterile fashion. An adequate timeout was obtained and everyone agreed. A red rubber catheter is used to drain her bladder. A bivalve speculum was placed inside the vagina and the cervix visualized. The cervix was grasped anteriorly with a single-tooth tenaculum.  The uterus sounded to 7 cm. Sequential dilation was done to a #8 dilator, and the myosure hysteroscope was introduced into the uterine cavity. The cervix and endometrial lining appeared normal both ostia were seen there was no deformity of the cavity.  A likely small polyp was seen and removed with the myosure light.  This was also used to sample the entire cavity.  A sharp curettage was then performed to ensure complete sampling of the cavity. and was sent to pathology. All instrument, sponge and lap counts were correct x2. The patient was awakened taken to recovery room in stable condition. Punch biopsy 6mm atypical mole on the left inner thigh was  performed and 4.0 vicryl was used for hemostasis. Reinaldo Caras MD 10/22/2023 3:31 PM

## 2023-10-22 NOTE — Anesthesia Preprocedure Evaluation (Addendum)
 Anesthesia Evaluation  Patient identified by MRN, date of birth, ID band Patient awake    Reviewed: Allergy & Precautions, NPO status , Patient's Chart, lab work & pertinent test results, reviewed documented beta blocker date and time   Airway Mallampati: I  TM Distance: >3 FB Neck ROM: Full    Dental  (+) Teeth Intact, Dental Advisory Given   Pulmonary former smoker   Pulmonary exam normal breath sounds clear to auscultation       Cardiovascular Normal cardiovascular exam+ dysrhythmias (eliquis (has not started yet, recent diagnosis)) Atrial Fibrillation  Rhythm:Regular Rate:Normal     Neuro/Psych negative neurological ROS  negative psych ROS   GI/Hepatic negative GI ROS, Neg liver ROS,,,  Endo/Other    Class 3 obesityBMI 49  Renal/GU negative Renal ROS  negative genitourinary   Musculoskeletal negative musculoskeletal ROS (+)    Abdominal  (+) + obese  Peds  Hematology negative hematology ROS (+)   Anesthesia Other Findings   Reproductive/Obstetrics negative OB ROS                             Anesthesia Physical Anesthesia Plan  ASA: 3  Anesthesia Plan: General   Post-op Pain Management: Tylenol  PO (pre-op)* and Toradol  IV (intra-op)*   Induction: Intravenous  PONV Risk Score and Plan: 4 or greater and Ondansetron , Dexamethasone, Midazolam  and Treatment may vary due to age or medical condition  Airway Management Planned: LMA  Additional Equipment: None  Intra-op Plan:   Post-operative Plan: Extubation in OR  Informed Consent: I have reviewed the patients History and Physical, chart, labs and discussed the procedure including the risks, benefits and alternatives for the proposed anesthesia with the patient or authorized representative who has indicated his/her understanding and acceptance.     Dental advisory given  Plan Discussed with: CRNA  Anesthesia Plan Comments:         Anesthesia Quick Evaluation

## 2023-10-22 NOTE — Interval H&P Note (Signed)
 History and Physical Interval Note:  10/22/2023 11:37 AM  Crystal York  has presented today for surgery, with the diagnosis of Thickened endometrium, hx of colon cancer, endometrium polyp.  The various methods of treatment have been discussed with the patient and family. After consideration of risks, benefits and other options for treatment, the patient has consented to  Procedure(s) with comments: DILATATION & CURETTAGE/HYSTEROSCOPY WITH RESECTOCOPE (N/A) - Myosure and  ECC MYOSURE RESECTION (N/A) as a surgical intervention.  The patient's history has been reviewed, patient examined, no change in status, stable for surgery.  I have reviewed the patient's chart and labs.  Questions were answered to the patient's satisfaction.     Reinaldo Caras

## 2023-10-23 NOTE — Anesthesia Postprocedure Evaluation (Signed)
 Anesthesia Post Note  Patient: Crystal York  Procedure(s) Performed: DILATATION & CURETTAGE/HYSTEROSCOPY, punh biopsy inner thigh (Left) MYOSURE RESECTION     Patient location during evaluation: PACU Anesthesia Type: General Level of consciousness: awake and alert Pain management: pain level controlled Vital Signs Assessment: post-procedure vital signs reviewed and stable Respiratory status: spontaneous breathing, nonlabored ventilation and respiratory function stable Cardiovascular status: blood pressure returned to baseline Postop Assessment: no apparent nausea or vomiting Anesthetic complications: no   No notable events documented.  Last Vitals:  Vitals:   10/22/23 1645 10/22/23 1700  BP: (!) 106/53 104/64  Pulse: (!) 56 85  Resp: 19 16  Temp:  36.7 C  SpO2: 93% 91%    Last Pain:  Vitals:   10/22/23 1700  TempSrc:   PainSc: 2                  [name]

## 2023-10-24 ENCOUNTER — Encounter (HOSPITAL_COMMUNITY): Payer: Self-pay | Admitting: Obstetrics and Gynecology

## 2023-10-26 ENCOUNTER — Ambulatory Visit: Payer: Self-pay | Admitting: Obstetrics and Gynecology

## 2023-10-26 LAB — SURGICAL PATHOLOGY

## 2023-11-02 DIAGNOSIS — Z7901 Long term (current) use of anticoagulants: Secondary | ICD-10-CM | POA: Insufficient documentation

## 2023-11-02 DIAGNOSIS — R222 Localized swelling, mass and lump, trunk: Secondary | ICD-10-CM | POA: Insufficient documentation

## 2023-11-04 ENCOUNTER — Encounter: Payer: Self-pay | Admitting: Obstetrics and Gynecology

## 2023-11-04 ENCOUNTER — Ambulatory Visit (INDEPENDENT_AMBULATORY_CARE_PROVIDER_SITE_OTHER): Admitting: Obstetrics and Gynecology

## 2023-11-04 VITALS — BP 126/84 | HR 85

## 2023-11-04 DIAGNOSIS — Z09 Encounter for follow-up examination after completed treatment for conditions other than malignant neoplasm: Secondary | ICD-10-CM

## 2023-11-04 NOTE — Progress Notes (Unsigned)
 Patient presents for 2 week postop from [facility], E[facility] She is doing well. No fevers, VB, dysuria or severe abdominal pain.  LMP 10/01/2010 Comment: no sexually active     MyChart Status: Active  Results Release   Surgical pathology Order: [id number]  Status: Edited Result - FINAL     Next appt: 11/08/2023 at 11:00 AM in Cardiology (Will Cortland Ding, MD)   Test Result Released: Yes (seen)     Messages: Seen   1 Result Note     1 Patient Communication     View Follow-Up Encounter    Component Ref Range & Units (hover) 13 d ago  SURGICAL PATHOLOGY SURGICAL PATHOLOGY CASE: [id number] PATIENT: Crystal York Surgical Pathology Report     Clinical History: thickened endometrium, hx of colon cancer, endometrial polyp (cm)     FINAL MICROSCOPIC DIAGNOSIS:  A. SKIN, LEFT INNER THIGH, LESION, PUNCH BIOPSY: Seborrheic keratosis  B. ENDOCERVIX, CURETTAGE: Abundant mucus with admixed benign endocervical epithelium  C. ENDOMETRIUM, BIOPSY: Consistent with fragments of a benign endometrial polyp Benign disordered proliferative phase endometrium Negative for breakdown, atypia, hyperplasia and carcinoma      A/p PO 2 weeks doing well Resume all activities 3. RTC with any concerns or with heavy bleeding, fevers or severe abdominal pain. Patient encouraged to return with annual exams as well. Dr. Tia Flowers

## 2023-11-08 ENCOUNTER — Encounter: Payer: Self-pay | Admitting: Cardiology

## 2023-11-08 ENCOUNTER — Ambulatory Visit: Attending: Cardiology | Admitting: Cardiology

## 2023-11-08 VITALS — BP 134/70 | HR 76 | Ht 63.5 in | Wt 285.0 lb

## 2023-11-08 DIAGNOSIS — D6869 Other thrombophilia: Secondary | ICD-10-CM

## 2023-11-08 DIAGNOSIS — I480 Paroxysmal atrial fibrillation: Secondary | ICD-10-CM

## 2023-11-08 MED ORDER — RIVAROXABAN 20 MG PO TABS
20.0000 mg | ORAL_TABLET | Freq: Every day | ORAL | 2 refills | Status: AC
Start: 2023-11-08 — End: ?

## 2023-11-08 MED ORDER — METOPROLOL SUCCINATE ER 50 MG PO TB24: 50.0000 mg | ORAL_TABLET | Freq: Every day | ORAL | 2 refills | Status: AC

## 2023-11-08 NOTE — Progress Notes (Signed)
  Electrophysiology Office Note:   Date:  11/08/2023  ID:  Crystal York, DOB [birth year], MRN [id number]  Primary Cardiologist: None Primary Heart Failure: None Electrophysiologist: None      History of Present Illness:   Crystal York is a 73 y.o. female with h/o atrial fibrillation, colon cancer postchemotherapy and surgery in 2008, CKD stage IIIa, obesity, renal cancer post left nephrectomy seen today for  for Electrophysiology evaluation of atrial fibrillation at the request of [name].    Today, denies symptoms of palpitations, chest pain, dyspnea, orthopnea, PND, lower extremity edema, claudication, dizziness, presyncope, syncope, bleeding, or neurologic sequela. The patient is tolerating medications without difficulties.  She was recently diagnosed with atrial fibrillation.  She states that she had a bacterial infection as a child and potentially has some heart issues from that.  She has no chest pain or shortness of breath currently.  She is in normal rhythm.  When she is in atrial fibrillation, she feels weak, fatigued, short of breath.  She also has palpitations.  She was aware that her heart rate was in the 150s to 160s recently.  Review of systems complete and found to be negative unless listed in HPI.   EP Information / Studies Reviewed:    EKG is ordered today. Personal review as below.  EKG Interpretation Date/Time:  Monday November 08 2023 10:57:40 EDT Ventricular Rate:  76 PR Interval:  168 QRS Duration:  76 QT Interval:  364 QTC Calculation: 409 R Axis:   58  Text Interpretation: Normal sinus rhythm Normal ECG When compared with ECG of 04-Nov-2009 07:02, No significant change was found Confirmed by Tome Wilson ([id number]) on 11/08/2023 11:01:20 AM     Risk Assessment/Calculations:    CHA2DS2-VASc Score = 2   This indicates a 2.2% annual risk of stroke. The patient's score is based upon:        Physical Exam:   VS:  BP 134/70 (BP Location: Left Arm, Patient  Position: Sitting, Cuff Size: Large)   Pulse 76   Ht 5' 3.5 (1.613 m)   Wt 285 lb (129.3 kg)   LMP 10/01/2010 Comment: no sexually active  SpO2 95%   BMI 49.69 kg/m    Wt Readings from Last 3 Encounters:  11/08/23 285 lb (129.3 kg)  10/22/23 290 lb (131.5 kg)  09/08/23 298 lb (135.2 kg)     GEN: Well nourished, well developed in no acute distress NECK: No JVD; No carotid bruits CARDIAC: Regular rate and rhythm, no murmurs, rubs, gallops RESPIRATORY:  Clear to auscultation without rales, wheezing or rhonchi  ABDOMEN: Soft, non-tender, non-distended EXTREMITIES:  No edema; No deformity   ASSESSMENT AND PLAN:    1.  Paroxysmal atrial fibrillation: She is in normal rhythm today.  She feels weak, fatigued, short of breath when she is in atrial fibrillation with significant palpitations.  Her heart rate has been in the 160s.  She has been intermittently taking her Eliquis.  Makynli Stills switch to Xarelto.  Prisha Hiley increase Toprol-XL to 50 mg daily.  Destany Severns plan for transthoracic echo and have her follow-up in A-fib clinic to discuss rhythm control after echo.  2.  Secondary hypercoagulable state: On Eliquis.  Switching to Xarelto as above  Follow up with Afib Clinic post echo   Signed, Zebulun Deman Gladis Norton, MD

## 2023-11-08 NOTE — Patient Instructions (Addendum)
 Medication Instructions:  Your physician has recommended you make the following change in your medication:  INCREASE  Metoprolol Succinate (Toprol) 50 mg daily at bedtime STOP Eliquis START Xarelto 20 mg once daily at dinnertime   *If you need a refill on your cardiac medications before your next appointment, please call your pharmacy*  Lab Work: None ordered  If you have any lab test that is abnormal or we need to change your treatment, we will call you to review the results.  Testing/Procedures: Your physician has requested that you have an echocardiogram. Echocardiography is a painless test that uses sound waves to create images of your heart. It provides your doctor with information about the size and shape of your heart and how well your heart's chambers and valves are working. This procedure takes approximately one hour. There are no restrictions for this procedure. Please do NOT wear cologne, perfume, aftershave, or lotions (deodorant is allowed). Please arrive 15 minutes prior to your appointment time.  Please note: We ask at that you not bring children with you during ultrasound (echo/ vascular) testing. Due to room size and safety concerns, children are not allowed in the ultrasound rooms during exams. Our front office staff cannot provide observation of children in our lobby area while testing is being conducted. An adult accompanying a patient to their appointment will only be allowed in the ultrasound room at the discretion of the ultrasound technician under special circumstances. We apologize for any inconvenience.   Follow-Up: At [facility], you and your health needs are our priority.  As part of our continuing mission to provide you with exceptional heart care, our providers are all part of one team.  This team includes your primary Cardiologist (physician) and Advanced Practice Providers or APPs (Physician Assistants and Nurse Practitioners) who all work together to  provide you with the care you need, when you need it.  Your next appointment:   After your  echocardiogram  Provider:   You will follow up in the Atrial Fibrillation Clinic located at [facility]. Your provider will be: Clint R. Fenton, PA-C or Fairy Heinrich, PA-C     Thank you for choosing [facility]!!   Maeola Domino, RN [phone]

## 2023-12-08 ENCOUNTER — Other Ambulatory Visit: Payer: Self-pay | Admitting: *Deleted

## 2023-12-08 DIAGNOSIS — C187 Malignant neoplasm of sigmoid colon: Secondary | ICD-10-CM

## 2023-12-09 ENCOUNTER — Inpatient Hospital Stay: Payer: Self-pay

## 2023-12-09 ENCOUNTER — Ambulatory Visit: Payer: Self-pay | Admitting: Oncology

## 2023-12-09 ENCOUNTER — Other Ambulatory Visit: Payer: Medicare HMO

## 2023-12-09 ENCOUNTER — Inpatient Hospital Stay: Payer: Medicare HMO | Attending: Oncology | Admitting: Oncology

## 2023-12-09 VITALS — BP 99/76 | HR 66 | Temp 97.9°F | Resp 18 | Ht 63.5 in

## 2023-12-09 DIAGNOSIS — G8929 Other chronic pain: Secondary | ICD-10-CM | POA: Insufficient documentation

## 2023-12-09 DIAGNOSIS — Z905 Acquired absence of kidney: Secondary | ICD-10-CM | POA: Diagnosis not present

## 2023-12-09 DIAGNOSIS — Z85528 Personal history of other malignant neoplasm of kidney: Secondary | ICD-10-CM | POA: Insufficient documentation

## 2023-12-09 DIAGNOSIS — R21 Rash and other nonspecific skin eruption: Secondary | ICD-10-CM | POA: Insufficient documentation

## 2023-12-09 DIAGNOSIS — C187 Malignant neoplasm of sigmoid colon: Secondary | ICD-10-CM

## 2023-12-09 DIAGNOSIS — Z85038 Personal history of other malignant neoplasm of large intestine: Secondary | ICD-10-CM | POA: Diagnosis present

## 2023-12-09 DIAGNOSIS — M549 Dorsalgia, unspecified: Secondary | ICD-10-CM | POA: Insufficient documentation

## 2023-12-09 DIAGNOSIS — Z9221 Personal history of antineoplastic chemotherapy: Secondary | ICD-10-CM | POA: Insufficient documentation

## 2023-12-09 LAB — CEA (ACCESS): CEA (CHCC): 1.37 ng/mL (ref 0.00–5.00)

## 2023-12-09 NOTE — Telephone Encounter (Signed)
-----   Message from Arley Hof sent at 12/09/2023  2:16 PM EDT ----- Please call patient, the CEA is normal, follow-up as scheduled  ----- Message ----- From: Rebecka, [facility] [place] Sent: 12/09/2023  10:53 AM EDT To: Arley KATHEE Hof, MD

## 2023-12-09 NOTE — Telephone Encounter (Signed)
 Patient voiced understanding, no further questions asked.

## 2023-12-09 NOTE — Progress Notes (Signed)
 [facility] OFFICE PROGRESS NOTE   Diagnosis: Colon cancer, renal cell cancer  INTERVAL HISTORY:   Crystal York returns as scheduled.  She feels well.  No difficulty with bowel or bladder function.  No bleeding.  She struck her right leg on a jar and developed a superficial abrasion. She reports a recent diagnosis of atrial fibrillation.  She is now maintained on metoprolol  and Xarelto .  Objective:  Vital signs in last 24 hours:  Blood pressure 99/76, pulse 66, temperature 97.9 F (36.6 C), temperature source Temporal, resp. rate 18, height 5' 3.5 (1.613 m), last menstrual period 10/01/2010, SpO2 98%.    Lymphatics: No cervical, supraclavicular, axillary, or inguinal nodes Resp: Lungs clear bilaterally Cardio: Irregular GI: Mass, nontender, no hepatosplenomegaly Vascular: Ace's change of the lower leg bilaterally  Skin: Superficial abrasion measuring 4-5 cm at the right low pretibial area   Lab Results:  Lab Results  Component Value Date   WBC 6.6 10/22/2023   HGB 12.6 10/22/2023   HCT 38.7 10/22/2023   MCV 91.5 10/22/2023   PLT 127 (L) 10/22/2023   NEUTROABS 5.4 12/03/2022    CMP  Lab Results  Component Value Date   NA 139 10/22/2023   K 4.0 10/22/2023   CL 108 10/22/2023   CO2 21 (L) 10/22/2023   GLUCOSE 91 10/22/2023   BUN 17 10/22/2023   CREATININE 1.04 (H) 10/22/2023   CALCIUM 9.1 10/22/2023   PROT 7.0 04/26/2012   ALBUMIN 3.8 04/26/2012   AST 14 04/26/2012   ALT 17 04/26/2012   ALKPHOS 62 04/26/2012   BILITOT 0.4 04/26/2012   GFRNONAA 57 (L) 10/22/2023   GFRAA 51 (L) 10/01/2017    Lab Results  Component Value Date   CEA1 1.20 12/05/2020   CEA 1.25 12/03/2022    Lab Results  Component Value Date   INR 1.1 11/05/2006   LABPROT 14.1 11/05/2006    Imaging:  No results found.  Medications: I have reviewed the patient's current medications.   Assessment/Plan: Stage IIIC (T4 ,N2) colon cancer, status post partial colectomy  10/04/2006, followed by 12 cycles of adjuvant chemotherapy. Restaging CT evaluation 07/31/2009 showed no evidence of recurrent colon cancer. Restaging CT evaluation 08/01/2010 showed no evidence of recurrent colon cancer.   CT abdomen/pelvis-renal protocol  10/05/2014-negative for recurrent cancer. Status post colonoscopy by Dr. Rollin 10/02/2008 with findings of a sessile polyp in the ascending colon and small hemorrhoids. The pathology from the polyp confirmed a tubular adenoma. Negative colonoscopy 11/03/2013  Colonoscopy 03/24/2019-polyps removed from the transverse and ascending colon, tubular adenomas History of anemia. Enlarged prevascular lymph node on CT 11/03/2006, stable on CT scans April 2009, March 2010 and March 2011. History of renal insufficiency. History of an elevated preoperative CEA. CEA was normal on 10/01/2017 Status post left nephrectomy for stage I renal cell carcinoma 10/04/2006.  Cystic lesion of the subcutaneous tissue at the right lumbar level. A cyst has been present in this area dating back to CT scans from 2008. This is felt to likely be a benign finding. Chronic back pain-followed by a pain clinic       Disposition: Crystal York remains in clinical remission from colon cancer and renal cell carcinoma.  She is due for a colonoscopy later this year.  I recommended she keep the right pretibial skin breakdown clean and use a Neosporin type preparation.  She will follow-up with her primary provider if the lesion does not heal.  Crystal York would like to continue follow-up  at the Cancer center.  She will return for an office visit in 1 year.  Arley Hof, MD  12/09/2023  10:44 AM

## 2023-12-21 ENCOUNTER — Ambulatory Visit (HOSPITAL_COMMUNITY)
Admission: RE | Admit: 2023-12-21 | Discharge: 2023-12-21 | Disposition: A | Discharge status: Home / Self Care | Source: Ambulatory Visit | Attending: Cardiology | Admitting: Cardiology

## 2023-12-21 DIAGNOSIS — D6869 Other thrombophilia: Secondary | ICD-10-CM | POA: Insufficient documentation

## 2023-12-21 DIAGNOSIS — I480 Paroxysmal atrial fibrillation: Secondary | ICD-10-CM | POA: Diagnosis not present

## 2023-12-21 LAB — ECHOCARDIOGRAM COMPLETE
AR max vel: 6.02 cm2
AV Area VTI: 5.92 cm2
AV Area mean vel: 6.04 cm2
AV Mean grad: 4 mmHg
AV Peak grad: 7.3 mmHg
Ao pk vel: 1.35 m/s
S' Lateral: 2.83 cm

## 2023-12-21 MED ORDER — PERFLUTREN LIPID MICROSPHERE
1.0000 mL | INTRAVENOUS | Status: AC | PRN
  Administered 2023-12-21: 2 mL via INTRAVENOUS

## 2023-12-22 ENCOUNTER — Encounter (HOSPITAL_COMMUNITY): Payer: Self-pay | Admitting: Internal Medicine

## 2023-12-22 ENCOUNTER — Ambulatory Visit (INDEPENDENT_AMBULATORY_CARE_PROVIDER_SITE_OTHER)
Admission: RE | Admit: 2023-12-22 | Discharge: 2023-12-22 | Disposition: A | Discharge status: Home / Self Care | Source: Ambulatory Visit | Attending: Internal Medicine | Admitting: Internal Medicine

## 2023-12-22 ENCOUNTER — Other Ambulatory Visit (HOSPITAL_COMMUNITY): Payer: Self-pay

## 2023-12-22 ENCOUNTER — Ambulatory Visit: Payer: Self-pay | Admitting: Cardiology

## 2023-12-22 VITALS — BP 132/80 | HR 107 | Ht 63.5 in | Wt 295.0 lb

## 2023-12-22 DIAGNOSIS — I480 Paroxysmal atrial fibrillation: Secondary | ICD-10-CM

## 2023-12-22 DIAGNOSIS — D6869 Other thrombophilia: Secondary | ICD-10-CM

## 2023-12-22 MED ORDER — FLECAINIDE ACETATE 50 MG PO TABS
50.0000 mg | ORAL_TABLET | Freq: Two times a day (BID) | ORAL | 3 refills | Status: DC
  Filled 2023-12-22: qty 60, 30d supply, fill #0

## 2023-12-22 NOTE — Progress Notes (Signed)
 Primary Care Physician: Lazoff, Shawn P, DO Primary Cardiologist: None Electrophysiologist: Will Gladis Norton, MD     Referring Physician: Dr. Norton Ronal VEAR Drucella is a 73 y.o. female with a history of colon cancer postchemotherapy and surgery in 2008, CKD stage IIIa, obesity, renal cancer post left nephrectomy, and atrial fibrillation who presents for consultation in the [facility] Atrial Fibrillation Clinic. Seen by Dr. Norton on 11/08/23 and plan for rhythm control after echocardiogram. Switched from Eliquis to Xarelto  for improved compliance. Patient is on Xarelto  for a CHADS2VASC score of 2.  On evaluation today, patient is currently in Afib. She has not missed any doses of Xarelto . She has noted to occasionally miss metoprolol  dose. She notes to snore.    Today, she denies symptoms of palpitations, chest pain, shortness of breath, orthopnea, PND, lower extremity edema, dizziness, presyncope, syncope, snoring, daytime somnolence, bleeding, or neurologic sequela. The patient is tolerating medications without difficulties and is otherwise without complaint today.    Atrial Fibrillation Risk Factors:  she does have symptoms or diagnosis of sleep apnea.   she has a BMI of Body mass index is 51.44 kg/m.SABRA Filed Weights   12/22/23 1039  Weight: 133.8 kg    Current Outpatient Medications  Medication Sig Dispense Refill   acetaminophen  (TYLENOL ) 500 MG tablet Take 500 mg by mouth every 6 (six) hours as needed.     carboxymethylcellulose (REFRESH TEARS) 0.5 % SOLN 1 drop daily.     diazepam (VALIUM) 5 MG tablet Take 5 mg by mouth daily as needed (vertigo). Reported on 09/17/2015     ELDERBERRY PO Take by mouth. Takes 2 gummies     fexofenadine (ALLEGRA) 180 MG tablet Take 180 mg by mouth daily.     flecainide  (TAMBOCOR ) 50 MG tablet Take 1 tablet (50 mg total) by mouth 2 (two) times daily. 60 tablet 3   metoprolol  succinate (TOPROL -XL) 50 MG 24 hr tablet Take 1 tablet (50 mg  total) by mouth daily. At bedtime 90 tablet 2   Multiple Vitamin (MULTIVITAMIN) tablet Take 1 tablet by mouth daily. Diet plan vitamin     NIACIN PO Take by mouth.     nystatin -triamcinolone  ointment (MYCOLOG) APPLY 1 APPLICATION TOPICALLY DAILY AS NEEDED. 30 g 11   rivaroxaban  (XARELTO ) 20 MG TABS tablet Take 1 tablet (20 mg total) by mouth daily with supper. 90 tablet 2   triamcinolone  ointment (KENALOG) 0.1 % SMARTSIG:1 Application Topical 2-3 Times Daily     No current facility-administered medications for this encounter.    Atrial Fibrillation Management history:  Previous antiarrhythmic drugs: none Previous cardioversions: none Previous ablations: none Anticoagulation history: Eliquis, Xarelto    ROS- All systems are reviewed and negative except as per the HPI above.  Physical Exam: BP 132/80   Pulse (!) 107   Ht 5' 3.5 (1.613 m)   Wt 133.8 kg   LMP 10/01/2010 Comment: no sexually active  BMI 51.44 kg/m   GEN: Well nourished, well developed in no acute distress NECK: No JVD; No carotid bruits CARDIAC: Irregularly irregular rate and rhythm, no murmurs, rubs, gallops RESPIRATORY:  Clear to auscultation without rales, wheezing or rhonchi  ABDOMEN: Soft, non-tender, non-distended EXTREMITIES:  No edema; No deformity   EKG today demonstrates  Vent. rate 107 BPM PR interval * ms QRS duration 74 ms QT/QTcB 336/448 ms P-R-T axes * 44 65 Atrial fibrillation with rapid ventricular response Cannot rule out Anterior infarct , age undetermined Abnormal ECG  When compared with ECG of 08-Nov-2023 10:57, Atrial fibrillation has replaced Sinus rhythm  Echo 12/21/23 demonstrated  1. Left ventricular ejection fraction, by estimation, is 55 to 60%. The  left ventricle has normal function. The left ventricle has no regional  wall motion abnormalities. There is mild asymmetric left ventricular  hypertrophy of the basal-septal segment.  Left ventricular diastolic parameters are  consistent with Grade I  diastolic dysfunction (impaired relaxation).   2. Right ventricular systolic function is normal. The right ventricular  size is normal. There is normal pulmonary artery systolic pressure. The  estimated right ventricular systolic pressure is 24.2 mmHg.   3. The mitral valve is grossly normal. Trivial mitral valve  regurgitation. No evidence of mitral stenosis.   4. The aortic valve is tricuspid. Aortic valve regurgitation is not  visualized. No aortic stenosis is present.   5. The inferior vena cava is normal in size with greater than 50%  respiratory variability, suggesting right atrial pressure of 3 mmHg.   ASSESSMENT & PLAN CHA2DS2-VASc Score = 2  The patient's score is based upon: CHF History: 0 HTN History: 0 Diabetes History: 0 Stroke History: 0 Vascular Disease History: 0 Age Score: 1 Gender Score: 1       ASSESSMENT AND PLAN: Paroxysmal Atrial Fibrillation (ICD10:  I48.0) The patient's CHA2DS2-VASc score is 2, indicating a 2.2% annual risk of stroke.    Patient is currently in Afib. She was seen recently by Dr. Inocencio and was in NSR. We had a long discussion about medication treatments and ablation in detail. We discussed potential options such as Multaq, flecainide , and Tikosyn. Patient is not a candidate for ablation due to BMI. We talked about the monitoring required for these medications, hospital admission for Tikosyn, and potential adverse effects. After discussion, patient would like to begin flecainide . Will begin flecainide  50 mg BID, continue Toprol  50 mg daily, and recheck EKG in 7-10 days. Continue Xarelto  20 mg daily.    PR 168 ms Qtc 409 ms CrCl 98 mL/min  Repeat ECG in 7-10 days.    Terra Pac, PA-C  Afib Clinic [facility] [address] [place], [place] [address] [phone]

## 2023-12-22 NOTE — Patient Instructions (Signed)
 Start flecainide  50 mg twice a day   [facility]  -- scheduling will call once insurance authorization received.

## 2024-01-04 ENCOUNTER — Ambulatory Visit (HOSPITAL_COMMUNITY)
Admission: RE | Admit: 2024-01-04 | Discharge: 2024-01-04 | Disposition: A | Discharge status: Home / Self Care | Source: Ambulatory Visit | Attending: Internal Medicine | Admitting: Internal Medicine

## 2024-01-04 DIAGNOSIS — I480 Paroxysmal atrial fibrillation: Secondary | ICD-10-CM | POA: Diagnosis not present

## 2024-01-04 DIAGNOSIS — D6869 Other thrombophilia: Secondary | ICD-10-CM

## 2024-01-04 MED ORDER — FLECAINIDE ACETATE 50 MG PO TABS: 50.0000 mg | ORAL_TABLET | Freq: Two times a day (BID) | ORAL | 6 refills | Status: DC

## 2024-01-04 NOTE — Progress Notes (Signed)
 Patient began flecainide  at [facility] on 8/6.   ECG today shows Vent. rate 59 BPM PR interval 182 ms QRS duration 82 ms QT/QTcB 398/394 ms P-R-T axes 9 31 57 Sinus bradycardia Otherwise normal ECG When compared with ECG of 22-Dec-2023 10:42, Sinus rhythm has replaced Atrial fibrillation    Patient is not doing exercise at this time so will defer treadmill test. Follow up in 6 months for flecainide  surveillance.

## 2024-02-14 ENCOUNTER — Telehealth: Payer: Self-pay

## 2024-02-14 NOTE — Telephone Encounter (Signed)
 The patient's schedule has been updated to reflect the provider's new office hours. A reminder letter has been sent with the revised appointment time.

## 2024-02-21 ENCOUNTER — Telehealth (HOSPITAL_BASED_OUTPATIENT_CLINIC_OR_DEPARTMENT_OTHER): Payer: Self-pay | Admitting: *Deleted

## 2024-02-21 NOTE — Telephone Encounter (Signed)
   Pre-operative Risk Assessment    Patient Name: Crystal York  DOB: 1951/02/16 MRN: [id number]   Date of last office visit: 11/08/23 DR. CAMNITZ Date of next office visit: NONE   Request for Surgical Clearance    Procedure:  COLONOSCOPY  Date of Surgery:  Clearance 04/07/24                                Surgeon:  DR. HUNG Surgeon's Group or Practice Name:  [facility] Phone number:  [phone] Fax number:  [phone]   Type of Clearance Requested:   - Medical  - Pharmacy:  Hold Rivaroxaban  (Xarelto )     Type of Anesthesia:  PROPOFOL    Additional requests/questions:    [name]   02/21/2024, 11:29 AM

## 2024-02-22 NOTE — Telephone Encounter (Signed)
 Please advise holding Xarelto  prior to colonoscopy on 04/07/2024  Thank you!  DW

## 2024-03-03 ENCOUNTER — Telehealth: Payer: Self-pay

## 2024-03-03 NOTE — Telephone Encounter (Signed)
   Name: Crystal York  DOB: June 16, 1950  MRN: [id number]  Primary Cardiologist: None   Preoperative team, please contact this patient and set up a phone call appointment for further preoperative risk assessment. Please obtain consent and complete medication review. Thank you for your help.  I confirm that guidance regarding antiplatelet and oral anticoagulation therapy has been completed and, if necessary, noted below.  Per office protocol, patient can hold Xarelto  for 2 days prior to procedure.  Patient will not need bridging with Lovenox (enoxaparin) around procedure.   I also confirmed the patient resides in the state of [place] . As per [facility] Medical Board telemedicine laws, the patient must reside in the state in which the provider is licensed.   Lamarr Satterfield, NP 03/03/2024, 4:02 PM [facility]

## 2024-03-03 NOTE — Telephone Encounter (Signed)
 Patient with diagnosis of atrial fibrillation on Xarelto  for anticoagulation.    Procedure:  COLONOSCOPY   Date of Surgery:  Clearance 04/07/24    CHA2DS2-VASc Score = 2   This indicates a 2.2% annual risk of stroke. The patient's score is based upon: CHF History: 0 HTN History: 0 Diabetes History: 0 Stroke History: 0 Vascular Disease History: 0 Age Score: 1 Gender Score: 1      CrCl 98 Platelet count 166  Patient has not had an Afib/aflutter ablation in the last 3 months, DCCV within the last 4 weeks or a watchman implanted in the last 45 days   Per office protocol, patient can hold Xarelto  for 2 days prior to procedure.   Patient will not need bridging with Lovenox (enoxaparin) around procedure.  **This guidance is not considered finalized until pre-operative APP has relayed final recommendations.**

## 2024-03-03 NOTE — Telephone Encounter (Signed)
 Preop tele appt now scheduled, med rec and consent done.

## 2024-03-03 NOTE — Telephone Encounter (Signed)
  Patient Consent for Virtual Visit        Crystal York has provided verbal consent on 03/03/2024 for a virtual visit (video or telephone).   CONSENT FOR VIRTUAL VISIT FOR:  Crystal York Staple  By participating in this virtual visit I agree to the following:  I hereby voluntarily request, consent and authorize [facility] and its employed or contracted physicians, physician assistants, nurse practitioners or other licensed health care professionals (the Practitioner), to provide me with telemedicine health care services (the "Services) as deemed necessary by the treating Practitioner. I acknowledge and consent to receive the Services by the Practitioner via telemedicine. I understand that the telemedicine visit will involve communicating with the Practitioner through live audiovisual communication technology and the disclosure of certain medical information by electronic transmission. I acknowledge that I have been given the opportunity to request an in-person assessment or other available alternative prior to the telemedicine visit and am voluntarily participating in the telemedicine visit.  I understand that I have the right to withhold or withdraw my consent to the use of telemedicine in the course of my care at any time, without affecting my right to future care or treatment, and that the Practitioner or I may terminate the telemedicine visit at any time. I understand that I have the right to inspect all information obtained and/or recorded in the course of the telemedicine visit and may receive copies of available information for a reasonable fee.  I understand that some of the potential risks of receiving the Services via telemedicine include:  Delay or interruption in medical evaluation due to technological equipment failure or disruption; Information transmitted may not be sufficient (e.g. poor resolution of images) to allow for appropriate medical decision making by the Practitioner;  and/or  In rare instances, security protocols could fail, causing a breach of personal health information.  Furthermore, I acknowledge that it is my responsibility to provide information about my medical history, conditions and care that is complete and accurate to the best of my ability. I acknowledge that Practitioner's advice, recommendations, and/or decision may be based on factors not within their control, such as incomplete or inaccurate data provided by me or distortions of diagnostic images or specimens that may result from electronic transmissions. I understand that the practice of medicine is not an exact science and that Practitioner makes no warranties or guarantees regarding treatment outcomes. I acknowledge that a copy of this consent can be made available to me via my patient portal [facility] MyChart), or I can request a printed copy by calling the office of [facility].    I understand that my insurance will be billed for this visit.   I have read or had this consent read to me. I understand the contents of this consent, which adequately explains the benefits and risks of the Services being provided via telemedicine.  I have been provided ample opportunity to ask questions regarding this consent and the Services and have had my questions answered to my satisfaction. I give my informed consent for the services to be provided through the use of telemedicine in my medical care

## 2024-03-14 ENCOUNTER — Other Ambulatory Visit: Payer: Self-pay | Admitting: Gastroenterology

## 2024-03-22 ENCOUNTER — Telehealth (HOSPITAL_BASED_OUTPATIENT_CLINIC_OR_DEPARTMENT_OTHER): Payer: Self-pay | Admitting: *Deleted

## 2024-03-22 NOTE — Telephone Encounter (Signed)
   Pre-operative Risk Assessment    Patient Name: Crystal York  DOB: 1950/06/02 MRN: [id number]   Date of last office visit: 12/22/23 [facility] Date of next office visit: 03/29/24 TELE PREOP APPT FOR ANOTHER PROCEDURE   Request for Surgical Clearance    Procedure:  EXCISION OF RIGHT BACK MASS  Date of Surgery:  Clearance 04/24/24                                Surgeon:  DR. PRENTICE WHITE Surgeon's Group or Practice Name:  [facility]  Phone number:  [phone] Fax number:  [phone]   Type of Clearance Requested:   - Medical    Type of Anesthesia:  Not Indicated   Additional requests/questions:    [name]   03/22/2024, 5:15 PM

## 2024-03-29 ENCOUNTER — Ambulatory Visit: Attending: Student in an Organized Health Care Education/Training Program | Admitting: Emergency Medicine

## 2024-03-29 DIAGNOSIS — M461 Sacroiliitis, not elsewhere classified: Secondary | ICD-10-CM | POA: Insufficient documentation

## 2024-03-29 DIAGNOSIS — M51369 Other intervertebral disc degeneration, lumbar region without mention of lumbar back pain or lower extremity pain: Secondary | ICD-10-CM | POA: Insufficient documentation

## 2024-03-29 DIAGNOSIS — Z01810 Encounter for preprocedural cardiovascular examination: Secondary | ICD-10-CM | POA: Diagnosis not present

## 2024-03-29 DIAGNOSIS — M47817 Spondylosis without myelopathy or radiculopathy, lumbosacral region: Secondary | ICD-10-CM | POA: Insufficient documentation

## 2024-03-29 DIAGNOSIS — I4819 Other persistent atrial fibrillation: Secondary | ICD-10-CM | POA: Insufficient documentation

## 2024-03-29 DIAGNOSIS — Z79891 Long term (current) use of opiate analgesic: Secondary | ICD-10-CM | POA: Insufficient documentation

## 2024-03-29 DIAGNOSIS — G894 Chronic pain syndrome: Secondary | ICD-10-CM | POA: Insufficient documentation

## 2024-03-29 NOTE — Progress Notes (Signed)
 Virtual Visit via Telephone Note   Because of Crystal York co-morbid illnesses, she is at least at moderate risk for complications without adequate follow up.  This format is felt to be most appropriate for this patient at this time.  Due to technical limitations with video connection web designer), today's appointment will be conducted as an audio only telehealth visit, and Crystal York verbally agreed to proceed in this manner.   All issues noted in this document were discussed and addressed.  No physical exam could be performed with this format.  Evaluation Performed:  Preoperative cardiovascular risk assessment _____________   Date:  03/29/2024   Patient ID:  Crystal York, DOB [birth year], MRN [id number] Patient Location:  Home Provider location:   Office  Primary Care Provider:  Lazoff, Shawn P, DO Primary Cardiologist:  None  Chief Complaint / Patient Profile   73 y.o. y/o female with a h/o atrial fibrillation, colon cancer s/p postchemotherapy and surgery in 2008, CKD stage IIIa, obesity, renal cancer s/p left nephrectomy who is pending a colonoscopy on 04/07/2024 and an excision of right back mass on 04/24/2024 and presents today for telephonic preoperative cardiovascular risk assessment.  History of Present Illness    Crystal York is a 73 y.o. female who presents via audio/video conferencing for a telehealth visit today.  Pt was last seen in cardiology clinic on 12/22/2023 by Fairy Heinrich, PA-C.  At that time Crystal York was doing well.  The patient is now pending procedure as outlined above. Since her last visit, She denies chest pain, palpitations, dyspnea, orthopnea, n, v, dark/tarry/bloody stools, hematuria, dizziness, syncope, edema. Patient is relatively inactive outside of performing light ADLs around her home.   Past Medical History    Past Medical History:  Diagnosis Date   Anemia    Anxiety    ASCUS of cervix with negative high risk HPV 04/2017   Benign papule     Chronic back pain    chronic pain deteriorating   Chronic rhinitis    Colon cancer [facility])    '08- chemotherapy after surgery   Diverticulitis    Dysrhythmia    Afib   Esophageal reflux    Family history of pancreatic cancer    H/O seasonal allergies    History of kidney stones    Kidney disease, chronic, stage III (GFR 30-59 ml/min) (HCC)    Knee pain, bilateral    Normocytic anemia    Obesity    Renal cancer (HCC)    '08 left neprectomy   Rosacea    Seasonal allergies    Seborrheic keratosis    Vaginal yeast infection    Vertigo    Past Surgical History:  Procedure Laterality Date   BALLOON DILATION N/A 03/24/2019   Procedure: BALLOON DILATION;  Surgeon: Rollin Dover, MD;  Location: [facility];  Service: Endoscopy;  Laterality: N/A;   BARIATRIC SURGERY     CARPAL TUNNEL RELEASE     CATARACT EXTRACTION, BILATERAL     lens implants   COLON SURGERY     COLONOSCOPY WITH PROPOFOL  N/A 11/03/2013   Procedure: COLONOSCOPY WITH PROPOFOL ;  Surgeon: Dover JONETTA Rollin, MD;  Location: [facility];  Service: Endoscopy;  Laterality: N/A;   COLONOSCOPY WITH PROPOFOL  N/A 03/24/2019   Procedure: COLONOSCOPY WITH PROPOFOL ;  Surgeon: Rollin Dover, MD;  Location: [facility];  Service: Endoscopy;  Laterality: N/A;   COLPOSCOPY     DILATATION & CURRETTAGE/HYSTEROSCOPY WITH RESECTOCOPE Left 10/22/2023   Procedure: DILATATION &  CURETTAGE/HYSTEROSCOPY, punh biopsy inner thigh;  Surgeon: Glennon Almarie POUR, MD;  Location: [facility] OR;  Service: Gynecology;  Laterality: Left;  Myosure and  ECC   ESOPHAGOGASTRODUODENOSCOPY (EGD) WITH PROPOFOL  N/A 03/24/2019   Procedure: ESOPHAGOGASTRODUODENOSCOPY (EGD) WITH PROPOFOL ;  Surgeon: Rollin Dover, MD;  Location: [facility];  Service: Endoscopy;  Laterality: N/A;   GASTRIC BYPASS  1982   HAND SURGERY     carpel tunnel surgery both hands   HEMORRHOID SURGERY  09/1969   HEMORRHOID SURGERY     HERNIA REPAIR  09/2006   HYSTEROSCOPY  10/2009   Endometrial  polyp   KIDNEY SURGERY     MYOSURE RESECTION N/A 10/22/2023   Procedure: MYOSURE RESECTION;  Surgeon: Glennon Almarie POUR, MD;  Location: [facility] OR;  Service: Gynecology;  Laterality: N/A;   NEPHRECTOMY     POLYPECTOMY  03/24/2019   Procedure: POLYPECTOMY;  Surgeon: Rollin Dover, MD;  Location: [facility];  Service: Endoscopy;;   radioactive therapy on back  2013   RADIOLOGY WITH ANESTHESIA N/A 03/28/2015   Procedure: MRI LUMBER SPINE WITHOUT;  Surgeon: Medication Radiologist, MD;  Location: [facility] OR;  Service: Radiology;  Laterality: N/A;   TONSILLECTOMY  09/1969   TUBAL LIGATION      Allergies  Allergies  Allergen Reactions   Hydrocodone  Nausea And Vomiting   Ciprofloxacin Swelling and Rash   Codeine Nausea And Vomiting and Rash   Macrodantin [Nitrofurantoin] Nausea And Vomiting and Rash   Sulfa  Antibiotics Rash    Home Medications    Prior to Admission medications   Medication Sig Start Date End Date Taking? Authorizing Provider  acetaminophen  (TYLENOL ) 500 MG tablet Take 500 mg by mouth every 6 (six) hours as needed.    [provider]  carboxymethylcellulose (REFRESH TEARS) 0.5 % SOLN 1 drop daily.    [provider]  diazepam (VALIUM) 5 MG tablet Take 5 mg by mouth daily as needed (vertigo). Reported on 09/17/2015    [provider]  ELDERBERRY PO Take by mouth. Takes 2 gummies    [provider]  fexofenadine (ALLEGRA) 180 MG tablet Take 180 mg by mouth daily.    [provider]  flecainide  (TAMBOCOR ) 50 MG tablet Take 1 tablet (50 mg total) by mouth 2 (two) times daily. 01/04/24   Terra Fairy PARAS, PA-C  metoprolol  succinate (TOPROL -XL) 50 MG 24 hr tablet Take 1 tablet (50 mg total) by mouth daily. At bedtime 11/08/23   Camnitz, Soyla Lunger, MD  Multiple Vitamin (MULTIVITAMIN) tablet Take 1 tablet by mouth daily. Diet plan vitamin    [provider]  NIACIN PO Take by mouth.    [provider]  nystatin -triamcinolone   ointment (MYCOLOG) APPLY 1 APPLICATION TOPICALLY DAILY AS NEEDED. 07/13/23   Glennon Almarie POUR, MD  rivaroxaban  (XARELTO ) 20 MG TABS tablet Take 1 tablet (20 mg total) by mouth daily with supper. 11/08/23   Camnitz, Soyla Lunger, MD  triamcinolone  ointment (KENALOG) 0.1 % SMARTSIG:1 Application Topical 2-3 Times Daily 08/21/23   [provider]    Physical Exam    Vital Signs:  Crystal York does not have vital signs available for review today.  Given telephonic nature of communication, physical exam is limited. AAOx3. NAD. Normal affect.  Speech and respirations are unlabored.  Accessory Clinical Findings    None  Assessment & Plan    1.  Preoperative Cardiovascular Risk Assessment: According to the Revised Cardiac Risk Index (RCRI), her Perioperative Risk of Major Cardiac Event is (%): 0.4  Her Functional Capacity in METs is: 4.06 according to the [employer] Activity Status Index (DASI). Therefore, based on ACC/AHA guidelines, patient would be at acceptable risk for the planned procedure without further cardiovascular testing.   The patient was advised that if she develops new symptoms prior to surgery to contact our office to arrange for a follow-up visit, and she verbalized understanding.  Colonoscopy: Per office protocol, patient can hold Xarelto  for 2 days prior to procedure.  A copy of this note will be routed to requesting surgeon.  Back excision Per office protocol, patient can hold Xarelto  for 2 days prior to procedure.   Time:   Today, I have spent 8 minutes with the patient with telehealth technology discussing medical history, symptoms, and management plan.     Hedy Garro E Jamie Hafford, NP  03/29/2024, 8:15 AM

## 2024-03-29 NOTE — Telephone Encounter (Signed)
 Crystal York will be doing VV visit today with patient and she will reach out to pharm team via secure chat for recs. Will remove from preop box.

## 2024-03-29 NOTE — Telephone Encounter (Signed)
 Patient with diagnosis of A Fib on Xarelto  for anticoagulation.    Procedure: EXCISION OF RIGHT BACK MASS  Date of procedure: 04/24/24   CHA2DS2-VASc Score = 2  This indicates a 2.2% annual risk of stroke. The patient's score is based upon: CHF History: 0 HTN History: 0 Diabetes History: 0 Stroke History: 0 Vascular Disease History: 0 Age Score: 1 Gender Score: 1       CrCl 98 ml/min Platelet count 166K  Patient has not had an Afib/aflutter ablation in the last 3 months, DCCV within the last 4 weeks or a watchman implanted in the last 45 days    Per office protocol, patient can hold Xarelto  for 2 days prior to procedure.      **This guidance is not considered finalized until pre-operative APP has relayed final recommendations.**

## 2024-03-30 NOTE — Progress Notes (Signed)
 Date of Service: 03/30/2024 Patient DOB: 02-Nov-1950   Subjective:     Patient ID: Crystal York is a 73 y.o. female. Patient comes in for Medicare Annual Wellness Visit Subsequent and Annual Exam The patient is a 73yo female who presents for Medicare wellness, annual physical, and medical evaluation. The patient has a history of       - Atrial fibrillation: Was found in the office on 10/01/2023 started on metoprolol  succinate 12.5 mg daily and Eliquis 5 mg twice daily with referral to cardiology where she saw Dr. Inocencio with [facility] where patient was found to be in normal sinus rhythm was thought to have paroxysmal atrial fibrillation.  Patient's Eliquis was switched to Xarelto  and her metoprolol  succinate was increased to 50 mg daily the patient had an cardiac echo 12/21/23 with EF 55-60%;       -snoring: going to see pulm             -Infra-renal abdominal aortic aneurysm: Found on CTA of the abdomen/pelvis on 11/18/2023, 3.1 cm with a recommendation to follow-up in 3 years. Currently seeing cardiology.                           - Anxiety: Uses diazepam 5 mg twice daily as needed                                     - Chronic renal failure stage III:                                     - History of colon cancer: Currently seeing oncology Dr. Cloretta, status post colectomy in 2008 with adjuvant chemotherapy, currently have repeat colonoscopy by Dr. Rollin on 04/07/2024                                   -History of renal cell carcinoma: Status post total left nephrectomy in 2008, currently seeing oncology and urologist.                    - Vertigo: Uses diazepam as needed                                     - Seasonal allergies: Currently uses Allegra 100 mg.                            -Elevated blood sugars:                 -lump on the right lower back area: been present for years but now  starting to get painful and pt would like to have it removed and pt feels like it has increased in size.  Patient had an ultrasound done in 06/23 showing a mass for patient was referred to general surgery where she saw Dr. Teresa who did a CT abdomen pelvis on 12/08/2023 showing: Well-circumscribed 2.3 cm near fluid density lesion in the subcutaneous tissues along the right lumbar area is stable to reflect a cyst. Dr. Teresa recommend repeat a CT scan in 3 months for reevaluation which  was done on 03/15/2024 showing stable  2.7 cm fluid density lesion in the subcutaneous tissue along the right lumbar region, likely a sebaceous cyst or patient is going to follow-up with the surgeon to have it removed on 04/24/2024                - Thickening endometrium: Currently seeing GYN with [facility] Dr. Glennon, Last Pap smear was on 06/06/2021 which was negative.  Had a D&C on 10/22/2023 that showed benign endometrial polyp        -leg swelling: unable to get compression stockings on her legs; unable to do diuretic due to only having 1 kidney;      C-scope:  History of colon cancer: Currently seeing oncology Dr. Cloretta, status post colectomy in 2008 with adjuvant chemotherapy     Pap smear: Currently seeing GYN with [facility] Dr. Glennon,    Mammogram: due 08/24 left breast ultrasound came back negative   Dexa: 05/25 Dexa scan normal     Vaccination: Currently due for tetanus, shingles, Prevnar 20, COVID, and flu vaccines (declined all).   Hep C screening: Declined by patient in the past   Lung cancer screening:  former smoker  AAA screening:  n/a  Labs:   06/25  CBC lab is now normal.  A1c of 5.2     11/24 CMP had creatinine 1.05 from 1.19, normal CBC, A1c of 5.7      Misc: 05/25 Dexa scan normal         05/0/25 normal DEXA scan ordered by GYN doctor.     Review of Systems Review of Systems  Constitutional:  Negative for chills, fatigue and fever.  HENT:  Negative for congestion,  hearing loss, rhinorrhea and sore throat.   Eyes:  Negative for pain and visual disturbance.  Respiratory:  Negative for cough, shortness of breath and wheezing.   Cardiovascular:  Positive for leg swelling. Negative for chest pain and palpitations.  Gastrointestinal:  Negative for abdominal distention, abdominal pain, anal bleeding, blood in stool, constipation, diarrhea, nausea, rectal pain and vomiting.  Genitourinary:  Negative for decreased urine volume, dysuria and hematuria.  Musculoskeletal:  Positive for arthralgias and back pain. Negative for gait problem, joint swelling, myalgias, neck pain and neck stiffness.  Skin:  Negative for rash and wound.  Neurological:  Negative for dizziness, seizures, syncope, weakness and headaches.  Hematological:  Negative for adenopathy. Does not bruise/bleed easily.  Psychiatric/Behavioral:  Negative for agitation, behavioral problems, sleep disturbance and suicidal ideas. The patient is nervous/anxious.      Medical History[1]  Surgical History[2]    Allergies[3]    Social History   Social History Narrative  . Not on file    Family History[4]    Objective:  BP (!) 126/51   Pulse 59   Temp 98.1 F (36.7 C)   Resp 14   Ht 1.626 m (5' 4.02)   SpO2 96%   BMI 50.36 kg/m   Physical Exam Physical Exam Constitutional:      General: She is not in acute distress.    Appearance: Normal appearance. She is not ill-appearing.  HENT:     Head: Normocephalic and atraumatic.     Right Ear: Tympanic membrane, ear canal and external ear normal. There is no impacted cerumen.     Left Ear: Tympanic membrane, ear canal and external ear normal. There is no impacted cerumen.     Nose: Nose normal. No congestion or rhinorrhea.     Mouth/Throat:  Mouth: Mucous membranes are moist.     Pharynx: No oropharyngeal exudate or posterior oropharyngeal erythema.  Eyes:     General: No scleral icterus.       Right eye: No discharge.        Left  eye: No discharge.     Extraocular Movements: Extraocular movements intact.     Conjunctiva/sclera: Conjunctivae normal.     Pupils: Pupils are equal, round, and reactive to light.  Neck:     Vascular: No carotid bruit.  Cardiovascular:     Rate and Rhythm: Normal rate and regular rhythm.     Heart sounds: No murmur heard. Pulmonary:     Effort: Pulmonary effort is normal. No respiratory distress.     Breath sounds: Normal breath sounds. No stridor. No wheezing, rhonchi or rales.  Abdominal:     General: There is no distension.     Palpations: Abdomen is soft. There is no mass.     Tenderness: There is no abdominal tenderness. There is no right CVA tenderness, left CVA tenderness, guarding or rebound.     Hernia: No hernia is present.  Musculoskeletal:        General: No swelling, tenderness, deformity or signs of injury.     Cervical back: No rigidity or tenderness.     Right lower leg: Edema present.     Left lower leg: Edema present.     Comments: +1 bilateral lower extremity pitting edema with symmetry with mild distal venous stasis but no wounds are warmth noted.  Lymphadenopathy:     Cervical: No cervical adenopathy.  Skin:    General: Skin is warm.     Findings: No erythema, lesion or rash.  Neurological:     General: No focal deficit present.     Mental Status: She is alert and oriented to person, place, and time. Mental status is at baseline.     Cranial Nerves: No cranial nerve deficit.     Motor: No weakness.     Gait: Gait normal.     Deep Tendon Reflexes: Reflexes normal.  Psychiatric:        Mood and Affect: Mood normal.        Behavior: Behavior normal.        Thought Content: Thought content normal.        Judgment: Judgment normal.         Labs: No results found for this or any previous visit (from the past week).  Assessment/Plan:  1. Encounter for Medicare annual wellness exam (Primary) Medicare questions were answered and reviewed. - CBC with  Differential  2. Physical exam Make appointment to obtain fasting labs now. Obtain mammogram now.  3. Atrial fibrillation, unspecified type Appears to be controlled, continue medication follow-up with cardiology as directed.  4. Infrarenal abdominal aortic aneurysm (AAA) without rupture Follow-up with cardiology as directed.  5. GAD (generalized anxiety disorder) Controlled, continue diazepam as needed and directed but call if your mood gets worse. - diazePAM (VALIUM) 5 mg tablet; Take one tablet twice a day as needed for anxiety or vertigo  Dispense: 60 tablet; Refill: 2  6. Chronic renal failure, stage 3a (CMD) Obtain CMP lab now.  Recommend to stay hydrated. - Comprehensive Metabolic Panel  7. History of colon cancer Patient is going to receive a repeat colonoscopy soon.  Follow-up with your GI doctor as directed.   8. H/O renal cell cancer  Follow-up with your urologist and oncologist as directed.  Avoid nephrotoxic  medications. - Comprehensive Metabolic Panel  9. Elevated blood sugar Obtain A1c lab now. Recommend a low fat/carbohydrate diet with increase in exercising.  - Hemoglobin A1C With Estimated Average Glucose  10. Other chronic pain Controlled, continue to use Tylenol  (acetaminophen ) as needed and directed for mild to moderate pain.  May use tramadol (Ultram) as needed and directed for moderate to severe pain but no operating heavy machinery or drinking alcohol while taking tramadol as it can cause drowsiness. - traMADoL (ULTRAM) 50 mg tablet; Take 1 tablet (50 mg total) by mouth every 6 (six) hours as needed for moderate pain (4-6). No refills sooner than 30 days from prior refill.  Dispense: 45 tablet; Refill: 2  11. OSA on CPAP Continue CPAP machine as directed.  12. Screening for lipoid disorders Make appointment to obtain fasting labs. - Lipid Panel  13. Encounter for screening mammogram for malignant neoplasm of breast Obtain mammogram now. - [facility] Breast  Screening Tomo Bilat; Future - [facility] Breast Screening Tomo Bilat  14.  Peripheral edema Chronic and stable, patient is unable to use compression stockings due to inability to get them on.  Will hold off on diuretics as patient only has 1 kidney.  May try Velcro compression stockings during the day.  Recommend elevate the legs while sitting or laying down.  Call if leg swelling gets worse or 1 leg becomes more swollen the other.  Make appointment to obtain fasting labs (CBC, CMP, lipids, A1c) now.  Follow-up in 6 months or sooner if needed.  Call for questions/concerns.  Return in 1 year (on 03/31/2025) for Medicare Annual Wellness Visit .  Electronically signed by: Shawn P Lazoff, DO 03/30/2024 1:14 PM  This document was created using the aid of voice recognition Dragon dictation software.    [facility] Medicare Wellness Visit Type:: Subsequent Annual Wellness Visit  Name: Crystal York Date of Birth: 05-Aug-1950 Age: 73 y.o. MRN: [id number] Visit Date: 03/30/2024  History obtained from: patient  Living Arrangements/Support System/Health Assessment/Pain/Stress Marital status: married Number of children: 2 and 5 grand and 3 great Occupation: retired Living arrangements: lives with spouse/significant other Does the patient have a support system (family, friend, church, conservation officer, nature, etc)?: Yes   Do you have any dental concerns?: No In the past month, have you experienced a change in your bladder control?: No   Do you have any difficulty obtaining your medications?: No   Do you have trouble consistently taking or remembering to take all of your medications as prescribed?: No Patient rates overall stress level as: None Does stress affect daily life?: No Typical amount of pain: some Does pain affect daily life?: (!) Yes Are you currently prescribed opioids?: No                Depression Screening  Depression Plan:  Normal/Negative Screening    Social History (Tobacco/Drugs/Sexual Activity) Jamieson reports that she quit smoking about 29 years ago. Her smoking use included cigarettes. She has never been exposed to tobacco smoke. She has never used smokeless tobacco. Tobacco Use?: No How many times in the past year have you used a recreational drug or used a prescription medication for nonmedical reasons?: None      Alcohol Screening How often do you have a drink containing alcohol?: Never How many standard drinks containing alcohol do you have on a typical day?: Never, 1 or 2 drinks How often do you have six or more drinks  on one occasion?: Never Audit-C Score: 0  Physical Activity Regular exercise?: (!) No      Diet How many meals a day?: 2 Eats fruit and vegetables daily?: Yes Most meals are obtained by: preparing their own meals  Home and Transportation Safety All rugs have non-skid backing?: N/A, no rugs All stairs or steps have railings?: Yes Grab bars in the bathtub or shower?: (!) No Have non-skid surface in bathtub or shower?: Yes Good home lighting?: Yes Regular seat belt use?: Yes      Activities of Daily Living Feed self?: Yes Bathe self?: Yes Dress self?: Yes Use toilet without assistance?: Yes Walk without assistance?: Yes    Instrumental Activities of Daily Living Manage finances?: Yes Shop for themselves?: Yes Prepare meals?: Yes Use the telephone?: Yes Manage medications?: Yes   Performs basic housework/laundry?: Yes Drives?: Yes Primary transportation is: driving  Hearing Concerns about hearing?: No Uses hearing aids?: No Hear whispered voice? (Observed): Yes  Vision Concerns about vision?: No Vision exam performed?: (!) No  Fall Risk Is the patient ambulatory?: Yes One or more falls in the last year:: No Feels unsteady when walking:: No  Cognitive Assessment Has a diagnosis of dementia or cognitive impairment?: No Are there any memory concerns  by the patient, others, or providers?: No              Advance Directives Living will?: Yes Advance directive information provided to patient: N/A Healthcare POA?: Yes Name of Health Care Agent: warren Relationship to patient: Spouse Health Care Agent's phone number: melissa richards         Other History I reviewed and updated the following risk factors and conditions as appropriate: Reviewed/Updated: Problem List, Medical History, Surgical History, Family History, Medications, Allergies Reviewed/Updated: Vital Signs (height, weight, and BP), Immunizations, Health Maintenance Patient Care Team Updated: Done  Vital Signs BP (!) 126/51   Pulse 59   Temp 98.1 F (36.7 C)   Resp 14   Ht 1.626 m (5' 4.02)   SpO2 96%   BMI 50.36 kg/m   Screening and Immunizations Health Maintenance Status       Date Due Completion Dates   DTaP/Tdap/Td Vaccines (1 - Tdap) Never done ---   ZOSTER VACCINE (1 of 2) Never done ---   Adult RSV (50+ Years or Pregnancy) (1 - Risk 60-74 years 1-dose series) Never done ---   Influenza Vaccine (1) Never done ---   Breast Cancer Screening (Mammogram) 12/31/2023 12/31/2022, 06/06/2019   COVID-19 Vaccine (1 - 2024-25 season) Never done ---   Pneumococcal Vaccine for Ages 50+ (1 of 1 - PCV) 09/30/2024 (Originally 07/06/2000) ---   Diabetes Screening 09/30/2024 10/01/2023, 03/30/2023   Depression Screening 01/10/2025 01/11/2024   Comprehensive Annual Visit 03/30/2025 03/30/2024, 03/30/2023 (Done)   Comment on 03/30/2023: CPE   Bone Density Scan 09/22/2025 09/23/2023 (Done)        There is no immunization history on file for this patient.  Assessment/Plan: Subsequent Annual Wellness Visit: The topics above were reviewed with the patient.  Healthy lifestyle principles reviewed.  Recommendations provided when indicated.  Follow up 1 year for next wellness visit.  No orders of the defined types were placed in this encounter.   New Medications  Ordered This Visit  Medications  . sod picosulf-mag ox-citric ac (Clenpiq) 10 mg-3.5 gram- 12 gram/175 mL soln    Sig: 175 mL. the first dose the evening before and second dose the morning of colonoscopy; duration: 2 days  Patient Care Team: Shawn P Lazoff, DO as PCP - General (Family Medicine) Shawn P Lazoff, DO as PCP - Attributed  Electronically signed by: Shawn P Lazoff, DO 03/30/2024 1:13 PM       [1] Past Medical History: Diagnosis Date  . Chronic right-sided low back pain without sciatica 05/20/2017  . Colon cancer    (CMD)   . Diverticulitis   . Esophageal reflux 04/06/2017  . Vaginal yeast infection 03/13/2021   by description not by exam.  FW       [2] Past Surgical History: Procedure Laterality Date  . CHOLECYSTECTOMY     Procedure: CHOLECYSTECTOMY  . COLECTOMY PARTIAL / TOTAL N/A 10/04/2006   Procedure: LAPAROSCOPIC ASSISTED SIGMOID COLECTOMY; Stage IIIC (T4 ,N2) colon cancer, status post partial colectomy followed by 12 cycles of adjuvant chemotherapy.  . COLONOSCOPY W/ POLYPECTOMY N/A 03/24/2019   Procedure: COLONOSCOPY W/ POLYPECTOMY; tubular adenomas transverse colon.  Sigmoid diverticulae.SABRA Belvie Just.  . HERNIA REPAIR     Procedure: HERNIA REPAIR  . NEPHRECTOMY     Procedure: NEPHRECTOMY  . TONSILLECTOMY AND ADENOIDECTOMY     Procedure: TONSILLECTOMY AND ADENOIDECTOMY  . TUBAL LIGATION     Procedure: TUBAL LIGATION  . UPPER GASTROINTESTINAL ENDOSCOPY N/A 06/23/2018   Procedure: UPPER GASTROINTESTINAL ENDOSCOPY; Balloon dilation of LES.SABRA Belvie Just MD.  [3] Allergies Allergen Reactions  . Ciprofloxacin Other (See Comments)    uknown  . Codeine Other (See Comments)    unknown  . Nitrofurantoin Macrocrystal Other (See Comments)    unknown  . Sulfasalazine Other (See Comments)  [4] Family History Problem Relation Name Age of Onset  . Heart attack Mother    . Colon cancer Father    . Pancreatic cancer Sister    . Lung cancer Sister     . Gout Daughter

## 2024-03-30 NOTE — Patient Instructions (Addendum)
 Make appointment to obtain fasting labs. Follow-up with your specialist as directed. Obtain your screening mammogram. Follow-up in 6 months or sooner if needed. Call for any questions/concerns.  Personalized Plan of Care  Health Maintenance Status       Date Due Completion Dates   DTaP/Tdap/Td Vaccines (1 - Tdap) Never done ---   ZOSTER VACCINE (1 of 2) Never done ---   Adult RSV (50+ Years or Pregnancy) (1 - Risk 60-74 years 1-dose series) Never done ---   Influenza Vaccine (1) Never done ---   Breast Cancer Screening (Mammogram) 12/31/2023 12/31/2022, 06/06/2019   COVID-19 Vaccine (1 - 2024-25 season) Never done ---   Pneumococcal Vaccine for Ages 50+ (1 of 1 - PCV) 09/30/2024 (Originally 07/06/2000) ---   Diabetes Screening 09/30/2024 10/01/2023, 03/30/2023   Depression Screening 01/10/2025 01/11/2024   Comprehensive Annual Visit 03/30/2025 03/30/2024, 03/30/2023 (Done)   Bone Density Scan 09/22/2025 09/23/2023 (Done)           There is no immunization history on file for this patient.    Advance Directives: Care Instructions Overview An advance directive is a legal way to state your wishes at the end of your life. It tells your loved ones and doctor what to do if you can't say what you want. There are two main types of advance directives. You can change them any time your wishes change. Living will. This form tells your loved ones and doctor your wishes about life support and other treatment. The form is also called a declaration. Medical power of attorney. This form lets you name a person to make treatment decisions for you when you can't speak for yourself. This person is called a health care agent (health care proxy, health care surrogate). The form is also called a durable power of attorney for health care. If you do not have an advance directive, decisions about your medical care may be made by a family member or doctor who doesn't know you or by a judge. It may help to  think of an advance directive as a gift to the people who care for you. If you have one, they won't have to make tough decisions by themselves. For more information, including forms for your state, see the [employer] website (plumberbiz.com.cy). Follow-up care is a key part of your treatment and safety. Be sure to make and go to all appointments, and call your doctor if you are having problems. It's also a good idea to know your test results and keep a list of the medicines you take. What should you include in an advance directive? Many states have a unique advance directive form. (It may ask you to address specific issues.) Or you might use a universal form that's approved by many states. If your form doesn't tell you what to address, it may be hard to know what to include in your advance directive. Use the questions below to help you get started. Who do you want to make decisions about your medical care if you are not able to? What life-support measures do you want if you have a serious illness that gets worse over time or can't be cured? What are you most afraid of that might happen? (Maybe you're afraid of having pain, losing your independence, or being kept alive by machines.) Where would you prefer to die? (Your home? A hospital? A nursing home?) Do you want to donate your organs when you die? Do you want certain religious practices performed before you die? When  should you call for help? Be sure to contact your doctor if you have any questions. Where can you learn more? Go to wirelessrelief.nl Enter R264 in the search box to learn more about Advance Directives: Care Instructions. Current as of: November 16, 2023 Content Version: 14.6  2024-2025 [place], [place].  Care instructions adapted under license by your healthcare professional. If you have questions about a medical condition or this instruction, always ask your  healthcare professional. Romayne Alderman, [facility] disclaims any warranty or liability for your use of this information.     A Healthy Lifestyle: Care Instructions A healthy lifestyle can help you feel good, have more energy, and stay at a weight that's healthy for you. You can share a healthy lifestyle with your friends and family. And you can do it on your own.  Eat meals with your friends or family. You could try cooking together.  Plan activities with other people. Go for a walk with a friend, try a free online fitness class, or join a sports league.   Eat a variety of healthy foods. These include fruits, vegetables, whole grains, low-fat dairy, and lean protein.  Choose healthy portions of food. You can use the Nutrition Facts label on food packages as a guide.   Eat more fruits and vegetables. You could add vegetables to sandwiches or add fruit to cereal.  Drink water when you are thirsty. Limit soda, juice, and sports drinks.   Try to exercise most days. Aim for at least 2 hours of exercise each week.  Keep moving. Work in the garden or take your dog on a walk. Use the stairs instead of the elevator.   If you use tobacco or nicotine, try to quit. Ask your doctor about programs and medicines to help you quit.  Limit alcohol. Men should have no more than 2 drinks a day. Women should have no more than 1. For some people, no alcohol is the best choice.  Follow-up care is a key part of your treatment and safety. Be sure to make and go to all appointments, and call your doctor if you are having problems. It's also a good idea to know your test results and keep a list of the medicines you take. Where can you learn more? Go to wirelessrelief.nl Enter [phone] in the search box to learn more about A Healthy Lifestyle: Care Instructions. Current as of: November 16, 2023 Content Version: 14.6  2024-2025 [place], [place].  Care instructions adapted under license by  your healthcare professional. If you have questions about a medical condition or this instruction, always ask your healthcare professional. Romayne Alderman, [facility] disclaims any warranty or liability for your use of this information.

## 2024-03-31 ENCOUNTER — Encounter (HOSPITAL_COMMUNITY): Payer: Self-pay | Admitting: Gastroenterology

## 2024-04-05 NOTE — Progress Notes (Signed)
 Please let patient know that her A1c for blood sugars was normal.  Patient's CMP lab was stable.  Patient's cholesterol had slightly elevated triglycerides (162) and will continue to observe.  Patient CBC had mildly low hemoglobin (12.0 from 13.7 from 13.2) which has decreased from prior lab and would recommend to repeat a CBC and iron lab in 3 months for reevaluation.  Call for questions/concerns.

## 2024-04-07 ENCOUNTER — Ambulatory Visit (HOSPITAL_COMMUNITY)
Admission: RE | Admit: 2024-04-07 | Discharge: 2024-04-07 | Disposition: A | Discharge status: Home / Self Care | Attending: Gastroenterology | Admitting: Gastroenterology

## 2024-04-07 ENCOUNTER — Encounter (HOSPITAL_COMMUNITY): Payer: Self-pay | Admitting: Gastroenterology

## 2024-04-07 ENCOUNTER — Ambulatory Visit (HOSPITAL_COMMUNITY): Admitting: Certified Registered"

## 2024-04-07 ENCOUNTER — Encounter (HOSPITAL_COMMUNITY): Admission: RE | Disposition: A | Payer: Self-pay | Source: Home / Self Care | Attending: Gastroenterology

## 2024-04-07 ENCOUNTER — Other Ambulatory Visit: Payer: Self-pay

## 2024-04-07 DIAGNOSIS — Z1211 Encounter for screening for malignant neoplasm of colon: Secondary | ICD-10-CM | POA: Diagnosis present

## 2024-04-07 DIAGNOSIS — Z6841 Body Mass Index (BMI) 40.0 and over, adult: Secondary | ICD-10-CM | POA: Insufficient documentation

## 2024-04-07 DIAGNOSIS — K5730 Diverticulosis of large intestine without perforation or abscess without bleeding: Secondary | ICD-10-CM | POA: Insufficient documentation

## 2024-04-07 DIAGNOSIS — I4819 Other persistent atrial fibrillation: Secondary | ICD-10-CM | POA: Diagnosis not present

## 2024-04-07 DIAGNOSIS — Z87891 Personal history of nicotine dependence: Secondary | ICD-10-CM | POA: Insufficient documentation

## 2024-04-07 DIAGNOSIS — Z85528 Personal history of other malignant neoplasm of kidney: Secondary | ICD-10-CM | POA: Diagnosis not present

## 2024-04-07 DIAGNOSIS — K219 Gastro-esophageal reflux disease without esophagitis: Secondary | ICD-10-CM | POA: Diagnosis not present

## 2024-04-07 DIAGNOSIS — Z7901 Long term (current) use of anticoagulants: Secondary | ICD-10-CM | POA: Diagnosis not present

## 2024-04-07 DIAGNOSIS — I4891 Unspecified atrial fibrillation: Secondary | ICD-10-CM | POA: Insufficient documentation

## 2024-04-07 DIAGNOSIS — Z980 Intestinal bypass and anastomosis status: Secondary | ICD-10-CM | POA: Diagnosis not present

## 2024-04-07 DIAGNOSIS — N1831 Chronic kidney disease, stage 3a: Secondary | ICD-10-CM | POA: Diagnosis not present

## 2024-04-07 DIAGNOSIS — E669 Obesity, unspecified: Secondary | ICD-10-CM | POA: Insufficient documentation

## 2024-04-07 DIAGNOSIS — Z87442 Personal history of urinary calculi: Secondary | ICD-10-CM | POA: Insufficient documentation

## 2024-04-07 DIAGNOSIS — N183 Chronic kidney disease, stage 3 unspecified: Secondary | ICD-10-CM | POA: Diagnosis not present

## 2024-04-07 DIAGNOSIS — F419 Anxiety disorder, unspecified: Secondary | ICD-10-CM | POA: Diagnosis not present

## 2024-04-07 DIAGNOSIS — Z860100 Personal history of colon polyps, unspecified: Secondary | ICD-10-CM | POA: Diagnosis not present

## 2024-04-07 DIAGNOSIS — Z85038 Personal history of other malignant neoplasm of large intestine: Secondary | ICD-10-CM | POA: Insufficient documentation

## 2024-04-07 DIAGNOSIS — D649 Anemia, unspecified: Secondary | ICD-10-CM | POA: Diagnosis not present

## 2024-04-07 DIAGNOSIS — Z905 Acquired absence of kidney: Secondary | ICD-10-CM | POA: Insufficient documentation

## 2024-04-07 HISTORY — PX: COLONOSCOPY: SHX5424

## 2024-04-07 SURGERY — COLONOSCOPY
Anesthesia: Monitor Anesthesia Care

## 2024-04-07 MED ORDER — PROPOFOL 500 MG/50ML IV EMUL
INTRAVENOUS | Status: DC | PRN
Start: 2024-04-07 — End: 2024-04-07
  Administered 2024-04-07: 150 ug/kg/min via INTRAVENOUS

## 2024-04-07 MED ORDER — SODIUM CHLORIDE 0.9 % IV SOLN: INTRAVENOUS | Status: DC

## 2024-04-07 MED ORDER — PROPOFOL 10 MG/ML IV BOLUS
INTRAVENOUS | Status: DC | PRN
  Administered 2024-04-07: 30 mg via INTRAVENOUS

## 2024-04-07 MED ORDER — PROPOFOL 1000 MG/100ML IV EMUL
INTRAVENOUS | Status: AC
  Filled 2024-04-07: qty 100

## 2024-04-07 MED ORDER — SODIUM CHLORIDE 0.9 % IV SOLN
INTRAVENOUS | Status: AC | PRN
  Administered 2024-04-07: 500 mL via INTRAMUSCULAR

## 2024-04-07 NOTE — Anesthesia Preprocedure Evaluation (Addendum)
 Anesthesia Evaluation  Patient identified by MRN, date of birth, ID band Patient awake    Reviewed: Allergy & Precautions, H&P , NPO status , Patient's Chart, lab work & pertinent test results, reviewed documented beta blocker date and time   Airway Mallampati: I  TM Distance: >3 FB Neck ROM: Full    Dental  (+) Teeth Intact, Dental Advisory Given   Pulmonary neg pulmonary ROS, former smoker   Pulmonary exam normal breath sounds clear to auscultation       Cardiovascular negative cardio ROS Normal cardiovascular exam+ dysrhythmias (eliquis (has not started yet, recent diagnosis)) Atrial Fibrillation  Rhythm:Regular Rate:Normal     Neuro/Psych   Anxiety     negative neurological ROS  negative psych ROS   GI/Hepatic Neg liver ROS,GERD  ,,  Endo/Other    Class 4 obesityBMI 49  Renal/GU Renal InsufficiencyRenal disease  negative genitourinary   Musculoskeletal negative musculoskeletal ROS (+)    Abdominal  (+) + obese  Peds negative pediatric ROS (+)  Hematology  (+) Blood dyscrasia, anemia   Anesthesia Other Findings   Reproductive/Obstetrics negative OB ROS                              Anesthesia Physical Anesthesia Plan  ASA: 3  Anesthesia Plan: MAC   Post-op Pain Management:    Induction: Intravenous  PONV Risk Score and Plan: 2 and Treatment may vary due to age or medical condition and Propofol  infusion  Airway Management Planned: Simple Face Mask  Additional Equipment: None  Intra-op Plan:   Post-operative Plan:   Informed Consent: I have reviewed the patients History and Physical, chart, labs and discussed the procedure including the risks, benefits and alternatives for the proposed anesthesia with the patient or authorized representative who has indicated his/her understanding and acceptance.     Dental advisory given  Plan Discussed with: CRNA  Anesthesia Plan  Comments:         Anesthesia Quick Evaluation

## 2024-04-07 NOTE — Op Note (Signed)
 [facility] Patient Name: Crystal York Procedure Date: 04/07/2024 MRN: [id number] Attending MD: Belvie Just , MD, [id number] Date of Birth: Nov 24, 1950 CSN: [id number] Age: 73 Admit Type: Outpatient Procedure:                Colonoscopy Indications:              High risk colon cancer surveillance: Personal                            history of colon cancer Providers:                Belvie Just, MD, Randall Lines, RN, Haskel Chris, Technician Referring MD:              Medicines:                Propofol  per Anesthesia Complications:            No immediate complications. Estimated Blood Loss:     Estimated blood loss: none. Procedure:                Pre-Anesthesia Assessment:                           - Prior to the procedure, a History and Physical                            was performed, and patient medications and                            allergies were reviewed. The patient's tolerance of                            previous anesthesia was also reviewed. The risks                            and benefits of the procedure and the sedation                            options and risks were discussed with the patient.                            All questions were answered, and informed consent                            was obtained. Prior Anticoagulants: The patient has                            taken no anticoagulant or antiplatelet agents. ASA                            Grade Assessment: III - A patient with severe                            systemic  disease. After reviewing the risks and                            benefits, the patient was deemed in satisfactory                            condition to undergo the procedure.                           - Sedation was administered by an anesthesia                            professional. Deep sedation was attained.                           After obtaining informed consent, the  colonoscope                            was passed under direct vision. Throughout the                            procedure, the patient's blood pressure, pulse, and                            oxygen saturations were monitored continuously. The                            CF-HQ190L ([id number] Olympus colonoscope was                            introduced through the anus and advanced to the the                            cecum, identified by appendiceal orifice and                            ileocecal valve. The colonoscopy was performed                            without difficulty. The patient tolerated the                            procedure well. The quality of the bowel                            preparation was evaluated using the BBPS [facility]                            Bowel Preparation Scale) with scores of: Right                            Colon = 2 (minor amount of residual staining, small  fragments of stool and/or opaque liquid, but mucosa                            seen well), Transverse Colon = 2 (minor amount of                            residual staining, small fragments of stool and/or                            opaque liquid, but mucosa seen well) and Left Colon                            = 2 (minor amount of residual staining, small                            fragments of stool and/or opaque liquid, but mucosa                            seen well). The total BBPS score equals 6. The                            quality of the bowel preparation was good. The                            ileocecal valve, appendiceal orifice, and rectum                            were photographed. Scope In: 7:28:41 AM Scope Out: 7:42:04 AM Scope Withdrawal Time: 0 hours 10 minutes 21 seconds  Total Procedure Duration: 0 hours 13 minutes 23 seconds  Findings:      Scattered large-mouthed, medium-mouthed and small-mouthed diverticula       were found in the entire  colon.      There was evidence of a prior end-to-end colo-colonic anastomosis in the       transverse colon. This was patent and was characterized by healthy       appearing mucosa. The anastomosis was traversed. Impression:               - Diverticulosis in the entire examined colon.                           - Patent end-to-end colo-colonic anastomosis,                            characterized by healthy appearing mucosa.                           - No specimens collected. Moderate Sedation:      Not Applicable - Patient had care per Anesthesia. Recommendation:           - Patient has a contact number available for                            emergencies. The signs and symptoms of potential  delayed complications were discussed with the                            patient. Return to normal activities tomorrow.                            Written discharge instructions were provided to the                            patient.                           - Resume regular diet.                           - Repeat colonoscopy in 5 years for surveillance. Procedure Code(s):        --- Professional ---                           [phone], Colonoscopy, flexible; diagnostic, including                            collection of specimen(s) by brushing or washing,                            when performed (separate procedure) Diagnosis Code(s):        --- Professional ---                           S14.961, Personal history of other malignant                            neoplasm of large intestine                           Z98.0, Intestinal bypass and anastomosis status                           K57.30, Diverticulosis of large intestine without                            perforation or abscess without bleeding CPT copyright 2022 [employer]. All rights reserved. The codes documented in this report are preliminary and upon coder review may  be revised to meet current  compliance requirements. Belvie Just, MD Belvie Just, MD 04/07/2024 7:49:12 AM This report has been signed electronically. Number of Addenda: 0

## 2024-04-07 NOTE — Transfer of Care (Signed)
 Immediate Anesthesia Transfer of Care Note  Patient: Crystal York  Procedure(s) Performed: COLONOSCOPY  Patient Location: PACU  Anesthesia Type:MAC  Level of Consciousness: awake, alert , and oriented  Airway & Oxygen Therapy: Patient Spontanous Breathing and Patient connected to face mask oxygen  Post-op Assessment: Report given to RN and Post -op Vital signs reviewed and stable  Post vital signs: Reviewed and stable  Last Vitals:  Vitals Value Taken Time  BP 104/54 04/07/24 07:48  Temp    Pulse 58 04/07/24 07:48  Resp 10 04/07/24 07:48  SpO2 100 % 04/07/24 07:48    Last Pain:  Vitals:   04/07/24 0748  TempSrc:   PainSc: 0-No pain      Patients Stated Pain Goal: 0 (04/07/24 0748)  Complications: No notable events documented.

## 2024-04-07 NOTE — H&P (Signed)
 Ronal VEAR Staple HPI: At this time the patient denies any problems with nausea, vomiting, fevers, chills, abdominal pain, diarrhea, constipation, hematochezia, melena, GERD, or dysphagia. The patient denies any known family history of colon cancers. No complaints of chest pain, SOB, or MI.  Her last colonosocpy was on 03/24/2019 and two small adenomas were removed from the ascending and transverse colon.  Her partial colectomy, of the transverse colon was on 10/04/2006.  She was recently diagnosed with afib 3 months ago and she was started on Xarelto .  Dr. Soyla Norton is her cardiologist.  The patient also anticipates being diagnosed with OSA.   Past Medical History:  Diagnosis Date   Anemia    Anxiety    ASCUS of cervix with negative high risk HPV 04/2017   Benign papule    Chronic back pain    chronic pain deteriorating   Chronic rhinitis    Colon cancer [facility])    '08- chemotherapy after surgery   Diverticulitis    Dysrhythmia    Afib   Esophageal reflux    Family history of pancreatic cancer    H/O seasonal allergies    History of kidney stones    Kidney disease, chronic, stage III (GFR 30-59 ml/min) (HCC)    Knee pain, bilateral    Normocytic anemia    Obesity    Renal cancer (HCC)    '08 left neprectomy   Rosacea    Seasonal allergies    Seborrheic keratosis    Vaginal yeast infection    Vertigo     Past Surgical History:  Procedure Laterality Date   BALLOON DILATION N/A 03/24/2019   Procedure: BALLOON DILATION;  Surgeon: Rollin Dover, MD;  Location: [facility];  Service: Endoscopy;  Laterality: N/A;   BARIATRIC SURGERY     CARPAL TUNNEL RELEASE     CATARACT EXTRACTION, BILATERAL     lens implants   COLON SURGERY     COLONOSCOPY WITH PROPOFOL  N/A 11/03/2013   Procedure: COLONOSCOPY WITH PROPOFOL ;  Surgeon: Dover JONETTA Rollin, MD;  Location: [facility];  Service: Endoscopy;  Laterality: N/A;   COLONOSCOPY WITH PROPOFOL  N/A 03/24/2019   Procedure: COLONOSCOPY WITH  PROPOFOL ;  Surgeon: Rollin Dover, MD;  Location: [facility];  Service: Endoscopy;  Laterality: N/A;   COLPOSCOPY     DILATATION & CURRETTAGE/HYSTEROSCOPY WITH RESECTOCOPE Left 10/22/2023   Procedure: DILATATION & CURETTAGE/HYSTEROSCOPY, punh biopsy inner thigh;  Surgeon: Glennon Almarie POUR, MD;  Location[facility] OR;  Service: Gynecology;  Laterality: Left;  Myosure and  ECC   ESOPHAGOGASTRODUODENOSCOPY (EGD) WITH PROPOFOL  N/A 03/24/2019   Procedure: ESOPHAGOGASTRODUODENOSCOPY (EGD) WITH PROPOFOL ;  Surgeon: Rollin Dover, MD;  Location: [facility];  Service: Endoscopy;  Laterality: N/A;   GASTRIC BYPASS  1982   HAND SURGERY     carpel tunnel surgery both hands   HEMORRHOID SURGERY  09/1969   HEMORRHOID SURGERY     HERNIA REPAIR  09/2006   HYSTEROSCOPY  10/2009   Endometrial polyp   KIDNEY SURGERY     MYOSURE RESECTION N/A 10/22/2023   Procedure: MYOSURE RESECTION;  Surgeon: Glennon Almarie POUR, MD;  Location: [facility] OR;  Service: Gynecology;  Laterality: N/A;   NEPHRECTOMY     POLYPECTOMY  03/24/2019   Procedure: POLYPECTOMY;  Surgeon: Rollin Dover, MD;  Location: [facility];  Service: Endoscopy;;   radioactive therapy on back  2013   RADIOLOGY WITH ANESTHESIA N/A 03/28/2015   Procedure: MRI LUMBER SPINE WITHOUT;  Surgeon: Medication Radiologist, MD;  Location: [facility] OR;  Service: Radiology;  Laterality: N/A;   TONSILLECTOMY  09/1969   TUBAL LIGATION      Family History  Problem Relation Age of Onset   Diabetes Mother    Hypertension Mother    Colon cancer Sister 59   Pancreatic cancer Sister 30   Lung cancer Sister 16       smoker   Lung cancer Other        niece; w/ metastasis; smoker   Colon cancer Other 62       niece w/ colon rupture - pt believes due to cancer   Colon cancer Father 17       infection vs. colon cancer   Heart Problems Father    Rheum arthritis Maternal Grandmother    Other Daughter        endometrial polyps   Cancer Sister        NOS cancer; no further  info   Other Brother        NOS blood disorder   Hodgkin's lymphoma Brother 40   Cancer Brother        Lymphoma   Lung cancer Brother    Leukemia Brother    Prostate cancer Other        nephew dx. late 16s   Breast cancer Other        niece dx. 20    Social History:  reports that she quit smoking about 28 years ago. Her smoking use included cigarettes. She started smoking about 30 years ago. She has a 0.4 pack-year smoking history. She has never used smokeless tobacco. She reports that she does not drink alcohol and does not use drugs.  Allergies:  Allergies  Allergen Reactions   Hydrocodone  Nausea And Vomiting   Ciprofloxacin Swelling and Rash   Codeine Nausea And Vomiting and Rash   Macrodantin [Nitrofurantoin] Nausea And Vomiting and Rash   Sulfa  Antibiotics Rash    Medications: Scheduled: Continuous:  sodium chloride      sodium chloride  500 mL (04/07/24 0648)    No results found for this or any previous visit (from the past 24 hours).   No results found.  ROS:  As stated above in the HPI otherwise negative.  Blood pressure (!) 141/65, pulse 68, temperature (!) 96.9 F (36.1 C), temperature source Temporal, resp. rate 20, height 5' 3.5 (1.613 m), weight 133 kg, last menstrual period 10/01/2010, SpO2 96%.    PE: Gen: NAD, Alert and Oriented HEENT:  [place]/AT, EOMI Neck: Supple, no LAD Lungs: CTA Bilaterally CV: RRR without M/G/R ABD: Soft, NTND, +BS Ext: No C/C/E  Assessment/Plan: 1) Personal history of colon cancer - colonoscopy.  Blaize Nipper D 04/07/2024, 7:14 AM

## 2024-04-07 NOTE — Discharge Instructions (Signed)

## 2024-04-07 NOTE — Anesthesia Postprocedure Evaluation (Signed)
 Anesthesia Post Note  Patient: [name]  Procedure(s) Performed: COLONOSCOPY     Patient location during evaluation: PACU Anesthesia Type: MAC Level of consciousness: awake and alert Pain management: pain level controlled Vital Signs Assessment: post-procedure vital signs reviewed and stable Respiratory status: spontaneous breathing, nonlabored ventilation and respiratory function stable Cardiovascular status: blood pressure returned to baseline and stable Postop Assessment: no apparent nausea or vomiting Anesthetic complications: no   No notable events documented.  Last Vitals:  Vitals:   04/07/24 0810 04/07/24 0818  BP: (!) 152/65 (!) 142/54  Pulse: (!) 56 (!) 58  Resp: 17 15  Temp:    SpO2: 99% 99%    Last Pain:  Vitals:   04/07/24 0818  TempSrc:   PainSc: 0-No pain                 Butler Levander Pinal

## 2024-04-09 ENCOUNTER — Encounter (HOSPITAL_COMMUNITY): Payer: Self-pay | Admitting: Gastroenterology

## 2024-04-20 ENCOUNTER — Encounter (HOSPITAL_COMMUNITY): Payer: Self-pay

## 2024-04-20 ENCOUNTER — Inpatient Hospital Stay (HOSPITAL_COMMUNITY): Admission: RE | Admit: 2024-04-20 | Discharge: 2024-04-20 | Attending: Obstetrics and Gynecology

## 2024-04-20 DIAGNOSIS — Z01419 Encounter for gynecological examination (general) (routine) without abnormal findings: Secondary | ICD-10-CM

## 2024-04-28 ENCOUNTER — Ambulatory Visit: Payer: Self-pay | Admitting: Obstetrics and Gynecology

## 2024-05-19 ENCOUNTER — Telehealth: Payer: Self-pay

## 2024-05-19 ENCOUNTER — Other Ambulatory Visit: Payer: Self-pay

## 2024-05-19 DIAGNOSIS — C642 Malignant neoplasm of left kidney, except renal pelvis: Secondary | ICD-10-CM

## 2024-05-19 NOTE — Telephone Encounter (Signed)
 Patient called to confirm if she needed a US  renal before her upcoming appt per her last OV note patient was advised she needed an US  renal prior to her upcoming appt she was made aware patient stated she will get that scheduled later today order was placed for US 

## 2024-05-25 ENCOUNTER — Ambulatory Visit (HOSPITAL_COMMUNITY)
Admission: RE | Admit: 2024-05-25 | Discharge: 2024-05-25 | Disposition: A | Discharge status: Home / Self Care | Source: Ambulatory Visit | Attending: Urology | Admitting: Urology

## 2024-05-25 DIAGNOSIS — C642 Malignant neoplasm of left kidney, except renal pelvis: Secondary | ICD-10-CM | POA: Insufficient documentation

## 2024-06-14 ENCOUNTER — Ambulatory Visit: Payer: Medicare HMO | Admitting: Urology

## 2024-06-14 DIAGNOSIS — C642 Malignant neoplasm of left kidney, except renal pelvis: Secondary | ICD-10-CM

## 2024-06-21 ENCOUNTER — Other Ambulatory Visit (HOSPITAL_COMMUNITY): Payer: Self-pay | Admitting: Internal Medicine

## 2024-07-07 ENCOUNTER — Ambulatory Visit (HOSPITAL_COMMUNITY): Admitting: Internal Medicine

## 2024-09-11 ENCOUNTER — Encounter: Admitting: Obstetrics and Gynecology

## 2024-11-03 ENCOUNTER — Ambulatory Visit: Admitting: Urology

## 2024-12-07 ENCOUNTER — Other Ambulatory Visit

## 2024-12-07 ENCOUNTER — Ambulatory Visit: Admitting: Oncology

## 2024-12-08 ENCOUNTER — Other Ambulatory Visit

## 2024-12-08 ENCOUNTER — Ambulatory Visit: Admitting: Oncology
